# Patient Record
Sex: Female | Born: 1965 | Race: White | Hispanic: No | Marital: Married | Smoking: Never smoker
Health system: Southern US, Community
[De-identification: ages and names within clinical notes are randomized; demographics above are authoritative.]

## PROBLEM LIST (undated history)

## (undated) DIAGNOSIS — F419 Anxiety disorder, unspecified: Secondary | ICD-10-CM

## (undated) DIAGNOSIS — F429 Obsessive-compulsive disorder, unspecified: Secondary | ICD-10-CM

## (undated) DIAGNOSIS — F32A Depression, unspecified: Secondary | ICD-10-CM

## (undated) DIAGNOSIS — N189 Chronic kidney disease, unspecified: Secondary | ICD-10-CM

## (undated) DIAGNOSIS — K219 Gastro-esophageal reflux disease without esophagitis: Secondary | ICD-10-CM

## (undated) HISTORY — DX: Obsessive-compulsive disorder, unspecified: F42.9

## (undated) HISTORY — DX: Anxiety disorder, unspecified: F41.9

## (undated) HISTORY — DX: Depression, unspecified: F32.A

## (undated) HISTORY — DX: Gastro-esophageal reflux disease without esophagitis: K21.9

## (undated) HISTORY — PX: LITHOTRIPSY: SUR834

## (undated) HISTORY — DX: Chronic kidney disease, unspecified: N18.9

---

## 1988-01-28 HISTORY — PX: TONSILLECTOMY: SUR1361

## 2000-01-28 HISTORY — PX: COSMETIC SURGERY: SHX468

## 2007-03-02 ENCOUNTER — Ambulatory Visit: Payer: Self-pay | Admitting: Anesthesiology

## 2007-05-21 ENCOUNTER — Emergency Department: Payer: Self-pay | Admitting: Emergency Medicine

## 2007-05-31 ENCOUNTER — Ambulatory Visit: Payer: Self-pay | Admitting: Urology

## 2007-06-03 ENCOUNTER — Ambulatory Visit: Payer: Self-pay | Admitting: Urology

## 2007-06-03 ENCOUNTER — Other Ambulatory Visit: Payer: Self-pay

## 2007-06-04 ENCOUNTER — Observation Stay: Payer: Self-pay | Admitting: Urology

## 2007-06-07 ENCOUNTER — Emergency Department: Payer: Self-pay | Admitting: Emergency Medicine

## 2009-07-31 ENCOUNTER — Ambulatory Visit: Payer: Self-pay | Admitting: Unknown Physician Specialty

## 2010-12-04 ENCOUNTER — Ambulatory Visit: Payer: Self-pay

## 2011-12-31 ENCOUNTER — Emergency Department: Payer: Self-pay | Admitting: Emergency Medicine

## 2011-12-31 LAB — CBC WITH DIFFERENTIAL/PLATELET
Basophil #: 0 10*3/uL (ref 0.0–0.1)
Eosinophil #: 0.1 10*3/uL (ref 0.0–0.7)
HCT: 36.1 % (ref 35.0–47.0)
HGB: 12.5 g/dL (ref 12.0–16.0)
Lymphocyte %: 9.3 %
MCH: 30.4 pg (ref 26.0–34.0)
MCHC: 34.5 g/dL (ref 32.0–36.0)
Monocyte #: 0.8 x10 3/mm (ref 0.2–0.9)
Neutrophil %: 83.7 %
RDW: 13.4 % (ref 11.5–14.5)
WBC: 12.8 10*3/uL — ABNORMAL HIGH (ref 3.6–11.0)

## 2011-12-31 LAB — COMPREHENSIVE METABOLIC PANEL
Anion Gap: 8 (ref 7–16)
BUN: 16 mg/dL (ref 7–18)
Chloride: 109 mmol/L — ABNORMAL HIGH (ref 98–107)
Creatinine: 0.91 mg/dL (ref 0.60–1.30)
EGFR (African American): >60
Glucose: 131 mg/dL — ABNORMAL HIGH (ref 65–99)
Osmolality: 284 (ref 275–301)
Potassium: 3.5 mmol/L (ref 3.5–5.1)
SGOT(AST): 19 U/L (ref 15–37)
SGPT (ALT): 20 U/L (ref 12–78)
Sodium: 141 mmol/L (ref 136–145)
Total Protein: 6.8 g/dL (ref 6.4–8.2)

## 2011-12-31 LAB — URINALYSIS, COMPLETE
Nitrite: NEGATIVE
Ph: 8 (ref 4.5–8.0)
Protein: NEGATIVE
RBC,UR: 393 /HPF (ref 0–5)
Specific Gravity: 1.015 (ref 1.003–1.030)
Squamous Epithelial: 1
WBC UR: 2 /HPF (ref 0–5)

## 2011-12-31 LAB — LIPASE, BLOOD: Lipase: 124 U/L (ref 73–393)

## 2012-01-01 ENCOUNTER — Ambulatory Visit: Payer: Self-pay | Admitting: Urology

## 2012-01-01 LAB — PREGNANCY, URINE: Pregnancy Test, Urine: NEGATIVE m[IU]/mL

## 2013-01-27 DIAGNOSIS — N200 Calculus of kidney: Secondary | ICD-10-CM

## 2013-01-27 HISTORY — DX: Calculus of kidney: N20.0

## 2014-07-18 ENCOUNTER — Other Ambulatory Visit: Payer: Self-pay | Admitting: Family Medicine

## 2014-07-18 DIAGNOSIS — F429 Obsessive-compulsive disorder, unspecified: Secondary | ICD-10-CM

## 2014-07-18 NOTE — Telephone Encounter (Signed)
Pt needs refill of medication Zitalopram 40 MG/day at night. Pt uses [facility] in [place]. Pt was sched to come in on 08/02/14 but MD will be out of office and soonest appt based on pt needs is Wed 08/09/14. Pt just needs refill to last until pt sees MD.

## 2014-07-19 ENCOUNTER — Other Ambulatory Visit: Payer: Self-pay | Admitting: *Deleted

## 2014-07-19 DIAGNOSIS — F32A Depression, unspecified: Secondary | ICD-10-CM

## 2014-07-19 DIAGNOSIS — F329 Major depressive disorder, single episode, unspecified: Secondary | ICD-10-CM

## 2014-07-19 NOTE — Telephone Encounter (Signed)
Refill initiated and routed to Dr Manuella Ghazi

## 2014-07-27 NOTE — Telephone Encounter (Signed)
Patient is checking status on the citalopram. She only have 5 pills left. Please return call once completed

## 2014-07-28 MED ORDER — CITALOPRAM HYDROBROMIDE 40 MG PO TABS: 40.0000 mg | ORAL_TABLET | Freq: Every day | ORAL | Status: DC

## 2014-07-28 NOTE — Telephone Encounter (Signed)
Called patient to discuss her medication. She has not formally established care with this provider. 15 day supply of citalopram 40 mg daily is sent to patient's pharmacy

## 2014-08-02 ENCOUNTER — Ambulatory Visit: Payer: Self-pay | Admitting: Family Medicine

## 2014-08-09 ENCOUNTER — Encounter: Payer: Self-pay | Admitting: Family Medicine

## 2014-08-09 ENCOUNTER — Ambulatory Visit (INDEPENDENT_AMBULATORY_CARE_PROVIDER_SITE_OTHER): Payer: 59 | Admitting: Family Medicine

## 2014-08-09 ENCOUNTER — Encounter (INDEPENDENT_AMBULATORY_CARE_PROVIDER_SITE_OTHER): Payer: Self-pay

## 2014-08-09 VITALS — BP 133/70 | HR 99 | Temp 98.8°F | Resp 18 | Ht 63.0 in | Wt 154.9 lb

## 2014-08-09 DIAGNOSIS — D492 Neoplasm of unspecified behavior of bone, soft tissue, and skin: Secondary | ICD-10-CM | POA: Insufficient documentation

## 2014-08-09 DIAGNOSIS — F428 Other obsessive-compulsive disorder: Secondary | ICD-10-CM | POA: Insufficient documentation

## 2014-08-09 DIAGNOSIS — K219 Gastro-esophageal reflux disease without esophagitis: Secondary | ICD-10-CM | POA: Diagnosis not present

## 2014-08-09 DIAGNOSIS — R5382 Chronic fatigue, unspecified: Secondary | ICD-10-CM | POA: Insufficient documentation

## 2014-08-09 DIAGNOSIS — F42 Obsessive-compulsive disorder: Secondary | ICD-10-CM | POA: Diagnosis not present

## 2014-08-09 DIAGNOSIS — N200 Calculus of kidney: Secondary | ICD-10-CM | POA: Insufficient documentation

## 2014-08-09 DIAGNOSIS — F429 Obsessive-compulsive disorder, unspecified: Secondary | ICD-10-CM | POA: Insufficient documentation

## 2014-08-09 MED ORDER — OMEPRAZOLE 20 MG PO CPDR: 20.0000 mg | DELAYED_RELEASE_CAPSULE | Freq: Every day | ORAL | Status: DC

## 2014-08-09 MED ORDER — CITALOPRAM HYDROBROMIDE 40 MG PO TABS: 40.0000 mg | ORAL_TABLET | Freq: Every day | ORAL | Status: DC

## 2014-08-09 NOTE — Progress Notes (Signed)
Name: Gloria Harrison   MRN: [id number]    DOB: 06-Aug-1965   Date:08/09/2014       Progress Note  Subjective  Chief Complaint  Chief Complaint  Patient presents with  . Establish Care    NP (Dr. Moffeit)/ Fatigue  . Medication Refill  . Gastrophageal Reflux   Fatigue Pt. is feeling more and more fatigued recently. She is complaining of feeling tired all the time. 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 Her concentration is also not as optimal as it used to be. She feels fatigued at the end of the day and is ready to go to bed.  Gastrophageal Reflux She complains of heartburn. She reports no abdominal pain, no belching, no chest pain, no choking, no coughing, no dysphagia, no nausea or no sore throat. This is a chronic problem. The symptoms are aggravated by certain foods (spicy foods and eating late in the evening). She has tried a PPI and an antacid for the symptoms. Past procedures do not include an EGD.  Obsessive Compulsive Disorder Pt. Has history of intrusive thoughts which make her perform a compulsive activity to help relieve her anxiety. For example, if her pillow falls on the floor, she had to throw it away and not use it again or to shake it a certain number of times or wipe it to make sure it was no longer contaminated. Symptoms started around 20 years ago, initially diagnosed as anxiety and depression and later when her symptoms got worse around 12 years ago, she was diagnosed with OCD and has been taking an SSRI since then. On Citalopram, she does not experience intrusive thoughts as frequently as she did and does not have the compulsion to act on them.    Past Medical History  Diagnosis Date  . GERD (gastroesophageal reflux disease)   . OCD (obsessive compulsive disorder)     Past Surgical History  Procedure Laterality Date  . Cosmetic  surgery  2002    Tummy tuck  . Tonsillectomy  1990  . Cesarean section  1996  . Cesarean section  2001    Family History  Problem Relation Age of Onset  . Kidney Stones Mother   . Diabetes Father   . Hyperlipidemia Father   . Hypertension Father   . Asthma Son     History   Social History  . Marital Status: Married    Spouse Name: N/A  . Number of Children: N/A  . Years of Education: N/A   Occupational History  . Not on file.   Social History Main Topics  . Smoking status: Never Smoker   . Smokeless tobacco: Never Used  . Alcohol Use: No  . Drug Use: No  . Sexual Activity:    Partners: Male    Birth Control/ Protection: Other-see comments     Comment: Husband had a Vasectomy.   Other Topics Concern  . Not on file   Social History Narrative  . No narrative on file     Current outpatient prescriptions:  .  citalopram (CELEXA) 40 MG tablet, Take 1 tablet (40 mg total) by mouth daily., Disp: 15 tablet, Rfl: 0 .  citalopram (CELEXA) 40 MG tablet, Take 1 tablet by mouth at bedtime., Disp: , Rfl:  .  omeprazole (PRILOSEC) 20 MG capsule, Take 20 mg by mouth daily., Disp: ,  Rfl: 0  Allergies  Allergen Reactions  . Ciprofloxacin Hives     Review of Systems  Constitutional: Negative for malaise/fatigue.  HENT: Negative for sore throat.   Respiratory: Negative for cough, choking and shortness of breath.   Cardiovascular: Negative for chest pain.  Gastrointestinal: Positive for heartburn. Negative for dysphagia, nausea and abdominal pain.  Musculoskeletal: Positive for myalgias and neck pain.  Psychiatric/Behavioral: Negative for depression. The patient is nervous/anxious. The patient does not have insomnia.      Objective  Filed Vitals:   08/09/14 1528  BP: 133/70  Pulse: 99  Temp: 98.8 F (37.1 C)  TempSrc: Oral  Resp: 18  Height: 5\' 3"  (1.6 m)  Weight: 154 lb 14.4 oz (70.262 kg)  SpO2: 97%    Physical Exam  Constitutional: She is oriented to  person, place, and time and well-developed, well-nourished, and in no distress.  HENT:  Head: Normocephalic and atraumatic.  Eyes: Conjunctivae are normal. Pupils are equal, round, and reactive to light.  Neck: Normal range of motion. Neck supple.  Cardiovascular: Normal rate and regular rhythm.   Pulmonary/Chest: Effort normal and breath sounds normal.  Abdominal: Soft. Bowel sounds are normal.  Musculoskeletal: Normal range of motion.  Neurological: She is alert and oriented to person, place, and time.  Skin: Skin is warm and dry.  Psychiatric: Memory, affect and judgment normal.  Nursing note and vitals reviewed.     Assessment & Plan 1. Gastroesophageal reflux disease, esophagitis presence not specified DC omeprazole due to potential interaction with citalopram which can cause QT prolongation. Patient advised to use Zantac 150 mg every evening. Return for follow-up if symptoms of heartburn recur. - omeprazole (PRILOSEC) 20 MG capsule; Take 1 capsule (20 mg total) by mouth daily.  Dispense: 90 capsule; Refill: 1  2. OCD (obsessive compulsive disorder) In terms stable and responsive to Celexa. Refill provided. Follow-up in 6 months - citalopram (CELEXA) 40 MG tablet; Take 1 tablet (40 mg total) by mouth daily.  Dispense: 90 tablet; Refill: 1  3. Chronic fatigue Check labs for evaluation of chronic fatigue. - CBC with Differential - Comprehensive Metabolic Panel (CMET) - TSH - B12 - Vitamin D (25 hydroxy)   Gloria Harrison [facility] 08/09/2014 4:27 PM

## 2014-08-10 LAB — TSH: TSH: 1.38 u[IU]/mL (ref 0.450–4.500)

## 2014-08-10 LAB — CBC WITH DIFFERENTIAL/PLATELET
BASOS: 0 %
Basophils Absolute: 0 10*3/uL (ref 0.0–0.2)
EOS (ABSOLUTE): 0.1 10*3/uL (ref 0.0–0.4)
Eos: 1 %
HEMATOCRIT: 39.9 % (ref 34.0–46.6)
HEMOGLOBIN: 13.1 g/dL (ref 11.1–15.9)
IMMATURE GRANS (ABS): 0 10*3/uL (ref 0.0–0.1)
Immature Granulocytes: 0 %
LYMPHS ABS: 1.8 10*3/uL (ref 0.7–3.1)
Lymphs: 19 %
MCH: 28.5 pg (ref 26.6–33.0)
MCHC: 32.8 g/dL (ref 31.5–35.7)
MCV: 87 fL (ref 79–97)
MONOS ABS: 0.8 10*3/uL (ref 0.1–0.9)
Monocytes: 8 %
Neutrophils Absolute: 7.1 10*3/uL — ABNORMAL HIGH (ref 1.4–7.0)
Neutrophils: 72 %
Platelets: 304 10*3/uL (ref 150–379)
RBC: 4.59 x10E6/uL (ref 3.77–5.28)
RDW: 14.2 % (ref 12.3–15.4)
WBC: 9.9 10*3/uL (ref 3.4–10.8)

## 2014-08-10 LAB — COMPREHENSIVE METABOLIC PANEL
A/G RATIO: 1.8 (ref 1.1–2.5)
ALBUMIN: 4.4 g/dL (ref 3.5–5.5)
ALT: 15 IU/L (ref 0–32)
AST: 12 IU/L (ref 0–40)
Alkaline Phosphatase: 62 IU/L (ref 39–117)
BUN/Creatinine Ratio: 18 (ref 9–23)
BUN: 11 mg/dL (ref 6–24)
Bilirubin Total: 0.3 mg/dL (ref 0.0–1.2)
CALCIUM: 9.6 mg/dL (ref 8.7–10.2)
CHLORIDE: 100 mmol/L (ref 97–108)
CO2: 24 mmol/L (ref 18–29)
Creatinine, Ser: 0.62 mg/dL (ref 0.57–1.00)
GFR calc Af Amer: 125 mL/min/{1.73_m2} (ref >59)
GFR calc non Af Amer: 108 mL/min/{1.73_m2} (ref >59)
GLOBULIN, TOTAL: 2.5 g/dL (ref 1.5–4.5)
Glucose: 89 mg/dL (ref 65–99)
Potassium: 3.9 mmol/L (ref 3.5–5.2)
SODIUM: 141 mmol/L (ref 134–144)
TOTAL PROTEIN: 6.9 g/dL (ref 6.0–8.5)

## 2014-08-10 LAB — VITAMIN D 25 HYDROXY (VIT D DEFICIENCY, FRACTURES): Vit D, 25-Hydroxy: 24.9 ng/mL — ABNORMAL LOW (ref 30.0–100.0)

## 2014-08-10 LAB — VITAMIN B12: Vitamin B-12: 564 pg/mL (ref 211–946)

## 2014-08-17 ENCOUNTER — Other Ambulatory Visit: Payer: Self-pay | Admitting: Family Medicine

## 2014-08-17 MED ORDER — VITAMIN D3 1.25 MG (50000 UT) PO TABS: 1.0000 | ORAL_TABLET | ORAL | Status: DC

## 2014-08-17 NOTE — Telephone Encounter (Signed)
Per Dr. Manuella Ghazi sent prescription for Vitamin D #12 for 12 weeks to [facility], results have been reported to patient.

## 2014-11-03 ENCOUNTER — Telehealth: Payer: Self-pay | Admitting: Family Medicine

## 2014-11-03 NOTE — Telephone Encounter (Signed)
Patient is wanting an alternative medication to the omeprazole for her heart burn.  Pt was taken off the omeprazole due to the potential side effect to the other medication that she is taken, citalopram.  Patient did try the otc medications but has had no relief.  Patient can be reached at [phone].

## 2014-11-03 NOTE — Telephone Encounter (Signed)
Routed to Dr. Shah for medication change 

## 2014-11-05 NOTE — Telephone Encounter (Signed)
Called and left a voice message for patient to schedule an office visit appointment to discuss an alternative medication

## 2014-11-06 NOTE — Telephone Encounter (Signed)
Dr. Manuella Ghazi has called and left a voice message for patient to schedule an office visit appointment to discuss an alternative medication

## 2014-11-22 ENCOUNTER — Encounter: Payer: Self-pay | Admitting: Family Medicine

## 2014-11-22 ENCOUNTER — Ambulatory Visit (INDEPENDENT_AMBULATORY_CARE_PROVIDER_SITE_OTHER): Payer: 59 | Admitting: Family Medicine

## 2014-11-22 VITALS — BP 132/71 | HR 113 | Temp 98.5°F | Resp 20 | Ht 63.0 in | Wt 159.2 lb

## 2014-11-22 DIAGNOSIS — G8929 Other chronic pain: Secondary | ICD-10-CM | POA: Insufficient documentation

## 2014-11-22 DIAGNOSIS — Z23 Encounter for immunization: Secondary | ICD-10-CM | POA: Diagnosis not present

## 2014-11-22 DIAGNOSIS — M79651 Pain in right thigh: Secondary | ICD-10-CM

## 2014-11-22 DIAGNOSIS — M545 Low back pain, unspecified: Secondary | ICD-10-CM | POA: Insufficient documentation

## 2014-11-22 DIAGNOSIS — K219 Gastro-esophageal reflux disease without esophagitis: Secondary | ICD-10-CM

## 2014-11-22 DIAGNOSIS — F429 Obsessive-compulsive disorder, unspecified: Secondary | ICD-10-CM

## 2014-11-22 MED ORDER — CELECOXIB 100 MG PO CAPS: 100.0000 mg | ORAL_CAPSULE | Freq: Two times a day (BID) | ORAL | Status: DC

## 2014-11-22 MED ORDER — PANTOPRAZOLE SODIUM 40 MG PO TBEC: 40.0000 mg | DELAYED_RELEASE_TABLET | Freq: Every day | ORAL | Status: DC

## 2014-11-22 MED ORDER — CITALOPRAM HYDROBROMIDE 40 MG PO TABS: 40.0000 mg | ORAL_TABLET | Freq: Every day | ORAL | Status: DC

## 2014-11-22 NOTE — Progress Notes (Signed)
Name: Gloria Harrison   MRN: [id number]    DOB: 25-Oct-1965   Date:11/22/2014       Progress Note  Subjective  Chief Complaint  Chief Complaint  Patient presents with  . Advice Only    Discuss medication Prilosec (generic brand) not working  . Medication Refill    celexa 40mg     Heartburn She complains of belching and heartburn. She reports no abdominal pain, no coughing, no dysphagia, no nausea or no sore throat. This is a chronic problem. The problem has been gradually worsening (worse since stopped Omeprazole. OTC antacids have not relieved her symptoms.). She has tried a PPI and an antacid for the symptoms.  Back Pain This is a recurrent problem. Episode onset: 2 weeks. The pain is present in the lumbar spine. The quality of the pain is described as aching. The pain radiates to the right thigh. The pain is at a severity of 4/10. The symptoms are aggravated by lying down. Pertinent negatives include no abdominal pain, bladder incontinence, bowel incontinence, fever, paresthesias, tingling or weakness. She has tried NSAIDs (OTC Tylenol) for the symptoms. The treatment provided moderate relief.    Past Medical History  Diagnosis Date  . GERD (gastroesophageal reflux disease)   . OCD (obsessive compulsive disorder)     Past Surgical History  Procedure Laterality Date  . Cosmetic surgery  2002    Tummy tuck  . Tonsillectomy  1990  . Cesarean section  1996  . Cesarean section  2001    Family History  Problem Relation Age of Onset  . Kidney Stones Mother   . Diabetes Father   . Hyperlipidemia Father   . Hypertension Father   . Asthma Son     Social History   Social History  . Marital Status: Married    Spouse Name: N/A  . Number of Children: N/A  . Years of Education: N/A   Occupational History  . Not on file.   Social History Main Topics  . Smoking status: Never Smoker   . Smokeless tobacco: Never Used  . Alcohol Use: No  . Drug Use: No  . Sexual Activity:   Partners: Male    Birth Control/ Protection: Other-see comments     Comment: Husband had a Vasectomy.   Other Topics Concern  . Not on file   Social History Narrative     Current outpatient prescriptions:  .  Cholecalciferol (VITAMIN D3) 50000 UNITS TABS, Take 1 tablet by mouth once a week., Disp: 12 tablet, Rfl: 0 .  citalopram (CELEXA) 40 MG tablet, Take 1 tablet (40 mg total) by mouth daily., Disp: 90 tablet, Rfl: 1  Allergies  Allergen Reactions  . Ciprofloxacin Hives     Review of Systems  Constitutional: Negative for fever and chills.  HENT: Negative for sore throat.   Respiratory: Negative for cough.   Gastrointestinal: Positive for heartburn. Negative for dysphagia, nausea, vomiting, abdominal pain and bowel incontinence.  Genitourinary: Negative for bladder incontinence.  Musculoskeletal: Positive for myalgias and back pain.  Neurological: Negative for tingling, weakness and paresthesias.  Psychiatric/Behavioral: Negative for depression. The patient is nervous/anxious.    Objective  Filed Vitals:   11/22/14 1456  BP: 132/71  Pulse: 113  Temp: 98.5 F (36.9 C)  TempSrc: Oral  Resp: 20  Height: 5\' 3"  (1.6 m)  Weight: 159 lb 3.2 oz (72.213 kg)  SpO2: 96%    Physical Exam  Constitutional: She is oriented to person, place, and time and well-developed,  well-nourished, and in no distress.  Cardiovascular: Normal rate, regular rhythm and normal heart sounds.   No murmur heard. Pulmonary/Chest: Effort normal and breath sounds normal. No respiratory distress. She has no wheezes.  Abdominal: Soft. Bowel sounds are normal. There is no tenderness.  Musculoskeletal:       Lumbar back: She exhibits normal range of motion, no tenderness and no pain.       Right upper leg: She exhibits no tenderness, no bony tenderness and no edema.  Mild tenderness to palpation over the right medial thigh, no muscle stiffness  Neurological: She is alert and oriented to person, place,  and time.  Nursing note and vitals reviewed.    Assessment & Plan  1. Need for immunization against influenza  - Flu Vaccine QUAD 36+ mos PF IM (Fluarix & Fluzone Quad PF)  2. Gastroesophageal reflux disease, esophagitis presence not specified We will start on pantoprazole 40 mg daily for symptoms of heartburn and reflux. - pantoprazole (PROTONIX) 40 MG tablet; Take 1 tablet (40 mg total) by mouth daily.  Dispense: 90 tablet; Refill: 0  3. OCD (obsessive compulsive disorder)  - citalopram (CELEXA) 40 MG tablet; Take 1 tablet (40 mg total) by mouth daily.  Dispense: 90 tablet; Refill: 1  4. Right thigh pain Likely from muscle sprain. Will start on Celebrex for 5 days and reassess. - celecoxib (CELEBREX) 100 MG capsule; Take 1 capsule (100 mg total) by mouth 2 (two) times daily.  Dispense: 10 capsule; Refill: 0   Sammye Staff [facility] 11/22/2014 3:22 PM

## 2015-01-15 ENCOUNTER — Encounter: Payer: Self-pay | Admitting: Family Medicine

## 2015-02-02 ENCOUNTER — Ambulatory Visit
Admission: RE | Admit: 2015-02-02 | Discharge: 2015-02-02 | Disposition: A | Discharge status: Home / Self Care | Payer: PRIVATE HEALTH INSURANCE | Source: Ambulatory Visit | Attending: Family Medicine | Admitting: Family Medicine

## 2015-02-02 ENCOUNTER — Encounter: Payer: Self-pay | Admitting: Family Medicine

## 2015-02-02 ENCOUNTER — Ambulatory Visit (INDEPENDENT_AMBULATORY_CARE_PROVIDER_SITE_OTHER): Payer: PRIVATE HEALTH INSURANCE | Admitting: Family Medicine

## 2015-02-02 VITALS — BP 128/70 | HR 97 | Temp 98.3°F | Resp 17 | Ht 63.0 in | Wt 160.9 lb

## 2015-02-02 DIAGNOSIS — M545 Low back pain: Secondary | ICD-10-CM | POA: Insufficient documentation

## 2015-02-02 DIAGNOSIS — G8929 Other chronic pain: Secondary | ICD-10-CM | POA: Insufficient documentation

## 2015-02-02 MED ORDER — TIZANIDINE HCL 4 MG PO CAPS: 4.0000 mg | ORAL_CAPSULE | Freq: Three times a day (TID) | ORAL | Status: DC | PRN

## 2015-02-02 NOTE — Progress Notes (Signed)
Name: Gloria Harrison   MRN: [id number]    DOB: 1965/04/23   Date:02/02/2015       Progress Note  Subjective  Chief Complaint  Chief Complaint  Patient presents with  . Back Pain    Back Pain This is a chronic problem. Episode onset: Onset 4 months ago. The pain is present in the lumbar spine. The pain radiates to the right thigh. The pain is at a severity of 4/10. The pain is moderate (thinks she is 'getting used to the pain'). The symptoms are aggravated by bending, position and lying down. Pertinent negatives include no bladder incontinence, bowel incontinence, leg pain, numbness, paresis or tingling. She has tried chiropractic manipulation, analgesics and NSAIDs for the symptoms. The treatment provided moderate (moderate improvement with chiropractic manipulation.) relief.    Past Medical History  Diagnosis Date  . GERD (gastroesophageal reflux disease)   . OCD (obsessive compulsive disorder)     Past Surgical History  Procedure Laterality Date  . Cosmetic surgery  2002    Tummy tuck  . Tonsillectomy  1990  . Cesarean section  1996  . Cesarean section  2001    Family History  Problem Relation Age of Onset  . Kidney Stones Mother   . Diabetes Father   . Hyperlipidemia Father   . Hypertension Father   . Asthma Son     Social History   Social History  . Marital Status: Married    Spouse Name: N/A  . Number of Children: N/A  . Years of Education: N/A   Occupational History  . Not on file.   Social History Main Topics  . Smoking status: Never Smoker   . Smokeless tobacco: Never Used  . Alcohol Use: No  . Drug Use: No  . Sexual Activity:    Partners: Male    Birth Control/ Protection: Other-see comments     Comment: Husband had a Vasectomy.   Other Topics Concern  . Not on file   Social History Narrative     Current outpatient prescriptions:  .  citalopram (CELEXA) 40 MG tablet, Take 1 tablet (40 mg total) by mouth daily., Disp: 90 tablet, Rfl: 1 .   pantoprazole (PROTONIX) 40 MG tablet, Take 1 tablet (40 mg total) by mouth daily., Disp: 90 tablet, Rfl: 0 .  celecoxib (CELEBREX) 100 MG capsule, Take 1 capsule (100 mg total) by mouth 2 (two) times daily. (Patient not taking: Reported on 02/02/2015), Disp: 10 capsule, Rfl: 0 .  Cholecalciferol (VITAMIN D3) 50000 UNITS TABS, Take 1 tablet by mouth once a week. (Patient not taking: Reported on 02/02/2015), Disp: 12 tablet, Rfl: 0  Allergies  Allergen Reactions  . Ciprofloxacin Hives     Review of Systems  Gastrointestinal: Negative for bowel incontinence.  Genitourinary: Negative for bladder incontinence.  Musculoskeletal: Positive for back pain. Negative for joint pain.  Neurological: Negative for tingling and numbness.    Objective  Filed Vitals:   02/02/15 1224  BP: 128/70  Pulse: 97  Temp: 98.3 F (36.8 C)  TempSrc: Oral  Resp: 17  Height: 5\' 3"  (1.6 m)  Weight: 160 lb 14.4 oz (72.984 kg)  SpO2: 95%    Physical Exam  Constitutional: She is well-developed, well-nourished, and in no distress.  Musculoskeletal:       Lumbar back: She exhibits tenderness, pain and spasm.       Back:  Nursing note and vitals reviewed.     Assessment & Plan  1. Chronic right-sided low  back pain without sciatica No relief with Celebrex. We'll obtain x-ray of lumbar spine to rule out musculoskeletal etiologies. Started on muscle relaxant therapy. - tiZANidine (ZANAFLEX) 4 MG capsule; Take 1 capsule (4 mg total) by mouth 3 (three) times daily as needed for muscle spasms.  Dispense: 30 capsule; Refill: 0 - DG Lumbar Spine Complete; Future   Amine Adelson [facility] 02/02/2015 12:31 PM

## 2015-02-07 ENCOUNTER — Ambulatory Visit: Payer: 59 | Admitting: Family Medicine

## 2015-03-10 ENCOUNTER — Other Ambulatory Visit: Payer: Self-pay | Admitting: Family Medicine

## 2015-03-26 ENCOUNTER — Telehealth: Payer: Self-pay | Admitting: Family Medicine

## 2015-03-26 NOTE — Telephone Encounter (Signed)
Patient is requesting a 3-6 month supply of Pantoprazole. States that it will be less expensive done this route. If you are not comfortable at doing a 6 month supply she is okay with the 3 month supply. Please send to [email or web address] and she will have it transferred to the company

## 2015-03-27 MED ORDER — PANTOPRAZOLE SODIUM 40 MG PO TBEC: 40.0000 mg | DELAYED_RELEASE_TABLET | Freq: Every day | ORAL | Status: DC

## 2015-03-27 NOTE — Telephone Encounter (Signed)
Medication has been refilled and sent to [facility] 

## 2015-04-10 ENCOUNTER — Telehealth: Payer: Self-pay | Admitting: Family Medicine

## 2015-04-10 NOTE — Telephone Encounter (Signed)
Routed to Dr. Shah for advice  

## 2015-04-10 NOTE — Telephone Encounter (Signed)
Patient states that you have tried giving prescription for ibuprofen and muscle relaxer and neither did not help. Have completed xray and nothing was found. Requesting a recommendation for physical therapy or Cortizone shot because she is not able to get any relief on her lower right back that runs down to knee.

## 2015-04-11 NOTE — Telephone Encounter (Signed)
Returned call and left a voice message for patient. Need to discuss her symptoms and then can place possible referral to either physical therapy or orthopedics.

## 2015-04-13 ENCOUNTER — Telehealth: Payer: Self-pay

## 2015-04-13 ENCOUNTER — Telehealth: Payer: Self-pay | Admitting: Family Medicine

## 2015-04-13 DIAGNOSIS — M545 Low back pain: Principal | ICD-10-CM

## 2015-04-13 DIAGNOSIS — G8929 Other chronic pain: Secondary | ICD-10-CM

## 2015-04-13 MED ORDER — TIZANIDINE HCL 4 MG PO CAPS: 4.0000 mg | ORAL_CAPSULE | Freq: Every day | ORAL | Status: DC

## 2015-04-13 NOTE — Telephone Encounter (Signed)
Prescription has been faxed to [facility] per Dr. Manuella Ghazi

## 2015-04-25 NOTE — Telephone Encounter (Signed)
errenous °

## 2015-05-25 ENCOUNTER — Other Ambulatory Visit: Payer: Self-pay | Admitting: Family Medicine

## 2015-06-08 ENCOUNTER — Encounter: Payer: Self-pay | Admitting: Family Medicine

## 2015-06-08 ENCOUNTER — Ambulatory Visit (INDEPENDENT_AMBULATORY_CARE_PROVIDER_SITE_OTHER): Payer: PRIVATE HEALTH INSURANCE | Admitting: Family Medicine

## 2015-06-08 VITALS — BP 120/72 | HR 111 | Temp 97.9°F | Resp 14 | Ht 63.0 in | Wt 160.9 lb

## 2015-06-08 DIAGNOSIS — F429 Obsessive-compulsive disorder, unspecified: Secondary | ICD-10-CM

## 2015-06-08 MED ORDER — CITALOPRAM HYDROBROMIDE 40 MG PO TABS: 40.0000 mg | ORAL_TABLET | Freq: Every day | ORAL | Status: DC

## 2015-06-08 NOTE — Progress Notes (Signed)
Name: Gloria Harrison   MRN: [id number]    DOB: 08/19/1965   Date:06/08/2015       Progress Note  Subjective  Chief Complaint  Chief Complaint  Patient presents with  . Depression    6 month follow up    HPI  Obsessive Compulsive Disorder: Improved, intrusive thoughts are still there but less frequent, no symptoms of depression but sometimes the intrusive thoughts make her anxious. Also has bizarre associations which are rare, no suicidal thoughts.  Past Medical History  Diagnosis Date  . GERD (gastroesophageal reflux disease)   . OCD (obsessive compulsive disorder)     Past Surgical History  Procedure Laterality Date  . Cosmetic surgery  2002    Tummy tuck  . Tonsillectomy  1990  . Cesarean section  1996  . Cesarean section  2001    Family History  Problem Relation Age of Onset  . Kidney Stones Mother   . Diabetes Father   . Hyperlipidemia Father   . Hypertension Father   . Asthma Son     Social History   Social History  . Marital Status: Married    Spouse Name: N/A  . Number of Children: N/A  . Years of Education: N/A   Occupational History  . Not on file.   Social History Main Topics  . Smoking status: Never Smoker   . Smokeless tobacco: Never Used  . Alcohol Use: No  . Drug Use: No  . Sexual Activity:    Partners: Male    Birth Control/ Protection: Other-see comments     Comment: Husband had a Vasectomy.   Other Topics Concern  . Not on file   Social History Narrative     Current outpatient prescriptions:  .  Cholecalciferol (VITAMIN D3) 50000 UNITS TABS, Take 1 tablet by mouth once a week. (Patient not taking: Reported on 02/02/2015), Disp: 12 tablet, Rfl: 0 .  citalopram (CELEXA) 40 MG tablet, Take 1 tablet (40 mg total) by mouth daily., Disp: 90 tablet, Rfl: 1 .  pantoprazole (PROTONIX) 40 MG tablet, Take 1 tablet (40 mg total) by mouth daily., Disp: 90 tablet, Rfl: 0 .  tiZANidine (ZANAFLEX) 4 MG capsule, Take 1 capsule (4 mg total) by mouth  at bedtime., Disp: 15 capsule, Rfl: 0  Allergies  Allergen Reactions  . Ciprofloxacin Hives     Review of Systems  Musculoskeletal: Positive for back pain.  Psychiatric/Behavioral: Negative for depression. The patient is not nervous/anxious.      Objective  Filed Vitals:   06/08/15 1127  BP: 120/72  Pulse: 111  Temp: 97.9 F (36.6 C)  TempSrc: Oral  Resp: 14  Height: 5\' 3"  (1.6 m)  Weight: 160 lb 14.4 oz (72.984 kg)  SpO2: 98%    Physical Exam  Constitutional: She is oriented to person, place, and time and well-developed, well-nourished, and in no distress.  HENT:  Head: Normocephalic and atraumatic.  Cardiovascular: Normal rate and regular rhythm.   Pulmonary/Chest: Effort normal and breath sounds normal.  Neurological: She is alert and oriented to person, place, and time.  Psychiatric: Mood, memory, affect and judgment normal.  Nursing note and vitals reviewed.     Assessment & Plan  1. OCD (obsessive compulsive disorder) Stable and much improved, refill for Celexa provided. - citalopram (CELEXA) 40 MG tablet; Take 1 tablet (40 mg total) by mouth daily.  Dispense: 90 tablet; Refill: 1 .  S[place] Downs [facility] 06/08/2015 11:30  AM

## 2015-12-04 ENCOUNTER — Encounter: Payer: Self-pay | Admitting: Family Medicine

## 2015-12-04 ENCOUNTER — Ambulatory Visit (INDEPENDENT_AMBULATORY_CARE_PROVIDER_SITE_OTHER): Payer: PRIVATE HEALTH INSURANCE | Admitting: Family Medicine

## 2015-12-04 VITALS — BP 120/65 | HR 90 | Temp 98.7°F | Resp 16 | Ht 63.0 in | Wt 150.4 lb

## 2015-12-04 DIAGNOSIS — F429 Obsessive-compulsive disorder, unspecified: Secondary | ICD-10-CM

## 2015-12-04 DIAGNOSIS — K219 Gastro-esophageal reflux disease without esophagitis: Secondary | ICD-10-CM

## 2015-12-04 DIAGNOSIS — B354 Tinea corporis: Secondary | ICD-10-CM

## 2015-12-04 MED ORDER — CITALOPRAM HYDROBROMIDE 40 MG PO TABS: 40.0000 mg | ORAL_TABLET | Freq: Every day | ORAL | 1 refills | Status: DC

## 2015-12-04 MED ORDER — PANTOPRAZOLE SODIUM 40 MG PO TBEC: 40.0000 mg | DELAYED_RELEASE_TABLET | Freq: Every day | ORAL | 1 refills | Status: DC

## 2015-12-04 MED ORDER — CLOTRIMAZOLE-BETAMETHASONE 1-0.05 % EX LOTN: TOPICAL_LOTION | Freq: Two times a day (BID) | CUTANEOUS | 0 refills | Status: DC

## 2015-12-04 NOTE — Progress Notes (Signed)
Name: Gloria Harrison   MRN: [id number]    DOB: [birth year]   Date:12/04/2015       Progress Note  Subjective  Chief Complaint  Chief Complaint  Patient presents with  . Rash    right leg x10 days    Rash  This is a new problem. The current episode started in the past 7 days. The affected locations include the right upper leg. The rash is characterized by itchiness, redness and scaling. Pertinent negatives include no fever or sore throat. Past treatments include anti-itch cream (antifungal ringworm cream). The treatment provided no relief.  Gastroesophageal Reflux  She complains of heartburn. She reports no abdominal pain, no belching, no chest pain, no nausea or no sore throat. This is a chronic problem. The problem has been unchanged. The symptoms are aggravated by certain foods and lying down (eating close to bedtime and spicy foods brings on the symptoms). She has tried a PPI for the symptoms. The treatment provided significant relief. Past procedures do not include an EGD.   Obsessive Compulsive Disorder: Pt. Presents for medication refill and follow up on Obsessive Compulsive Disorder. Symptoms include anxiety, repetitive thoughts associated with bizarre associations. She has no symptoms as long as she takes Citalopram 40 mg daily, no side effects reported.   Past Medical History:  Diagnosis Date  . GERD (gastroesophageal reflux disease)   . OCD (obsessive compulsive disorder)     Past Surgical History:  Procedure Laterality Date  . CESAREAN SECTION  1996  . CESAREAN SECTION  2001  . COSMETIC SURGERY  2002   Tummy tuck  . TONSILLECTOMY  1990    Family History  Problem Relation Age of Onset  . Kidney Stones Mother   . Diabetes Father   . Hyperlipidemia Father   . Hypertension Father   . Asthma Son     Social History   Social History  . Marital status: Married    Spouse name: N/A  . Number of children: N/A  . Years of education: N/A   Occupational History  . Not  on file.   Social History Main Topics  . Smoking status: Never Smoker  . Smokeless tobacco: Never Used  . Alcohol use No  . Drug use: No  . Sexual activity: Yes    Partners: Male    Birth control/ protection: Other-see comments     Comment: Husband had a Vasectomy.   Other Topics Concern  . Not on file   Social History Narrative  . No narrative on file     Current Outpatient Prescriptions:  .  citalopram (CELEXA) 40 MG tablet, Take 1 tablet (40 mg total) by mouth daily., Disp: 90 tablet, Rfl: 1 .  pantoprazole (PROTONIX) 40 MG tablet, Take 1 tablet (40 mg total) by mouth daily., Disp: 90 tablet, Rfl: 0 .  Cholecalciferol (VITAMIN D3) 50000 UNITS TABS, Take 1 tablet by mouth once a week. (Patient not taking: Reported on 12/04/2015), Disp: 12 tablet, Rfl: 0 .  tiZANidine (ZANAFLEX) 4 MG capsule, Take 1 capsule (4 mg total) by mouth at bedtime. (Patient not taking: Reported on 12/04/2015), Disp: 15 capsule, Rfl: 0  Allergies  Allergen Reactions  . Ciprofloxacin Hives     Review of Systems  Constitutional: Negative for chills, fever and malaise/fatigue.  HENT: Negative for sore throat.   Cardiovascular: Negative for chest pain.  Gastrointestinal: Positive for heartburn. Negative for abdominal pain and nausea.  Skin: Positive for rash.    Objective  Vitals:  12/04/15 1611  BP: 120/65  Pulse: 90  Resp: 16  Temp: 98.7 F (37.1 C)  TempSrc: Oral  SpO2: 98%  Weight: 150 lb 6.4 oz (68.2 kg)  Height: 5\' 3"  (1.6 m)    Physical Exam  Constitutional: She is oriented to person, place, and time and well-developed, well-nourished, and in no distress.  HENT:  Head: Normocephalic and atraumatic.  Cardiovascular: Normal rate, regular rhythm and normal heart sounds.   No murmur heard. Pulmonary/Chest: Effort normal and breath sounds normal.  Neurological: She is alert and oriented to person, place, and time.  Skin: Skin is warm. Rash noted. Rash is maculopapular. There is  erythema.  Psychiatric: Mood, memory, affect and judgment normal.  Nursing note and vitals reviewed.    Assessment & Plan  1. Tinea corporis Started on Lotrisone for treatment - clotrimazole-betamethasone (LOTRISONE) lotion; Apply topically 2 (two) times daily.  Dispense: 60 mL; Refill: 0  2. Obsessive-compulsive disorder, unspecified type  - citalopram (CELEXA) 40 MG tablet; Take 1 tablet (40 mg total) by mouth daily.  Dispense: 90 tablet; Refill: 1  3. Gastroesophageal reflux disease, esophagitis presence not specified  - pantoprazole (PROTONIX) 40 MG tablet; Take 1 tablet (40 mg total) by mouth daily.  Dispense: 90 tablet; Refill: 1   Galilea Quito [facility] 12/04/2015 4:24 PM

## 2015-12-05 ENCOUNTER — Telehealth: Payer: Self-pay | Admitting: Family Medicine

## 2015-12-05 NOTE — Telephone Encounter (Signed)
errenous °

## 2015-12-06 ENCOUNTER — Telehealth: Payer: Self-pay | Admitting: Family Medicine

## 2015-12-06 DIAGNOSIS — B354 Tinea corporis: Secondary | ICD-10-CM

## 2015-12-06 MED ORDER — CLOTRIMAZOLE-BETAMETHASONE 1-0.05 % EX CREA: 1.0000 "application " | TOPICAL_CREAM | Freq: Two times a day (BID) | CUTANEOUS | 0 refills | Status: DC

## 2015-12-06 NOTE — Telephone Encounter (Signed)
Patient was prescribed clotrimazole-betamethasone lotion and it will cost over $200. Patient called insurance and they will cover it in cream form and states it will have to be in 45grams. Prescribe it however you can to equal up to the 60 mL. Please send to [facility]

## 2015-12-06 NOTE — Telephone Encounter (Signed)
Changed to Lotrisone cream and Rx sent to pharmacy

## 2015-12-06 NOTE — Telephone Encounter (Signed)
Patient verbally informed °

## 2015-12-28 ENCOUNTER — Other Ambulatory Visit: Payer: Self-pay | Admitting: Family Medicine

## 2015-12-28 DIAGNOSIS — F429 Obsessive-compulsive disorder, unspecified: Secondary | ICD-10-CM

## 2016-03-31 ENCOUNTER — Telehealth: Payer: Self-pay | Admitting: Family Medicine

## 2016-03-31 NOTE — Telephone Encounter (Signed)
PT CALLED AND READ HER THE MESSAGE AND HAD TEMEKA TO TALK TO HER.

## 2016-03-31 NOTE — Telephone Encounter (Signed)
Pt is currently in [place] due to son trying to commit suicide. She would like to [facility] if it is possible that you give her a prescription for something that will help her relax/sleep. She need something to help cope. Maybe a small supply. Please send to [facility] (P) [phone]

## 2016-03-31 NOTE — Telephone Encounter (Signed)
Based on patient's symptoms and situation, she would benefit from alprazolam 0.25 mg daily at bedtime when necessary reduce anxiety and help with sleeping. However, alprazolam is a controlled substance and then not be called in to pharmacy in [place]. It has to be picked up in office.

## 2016-06-06 ENCOUNTER — Other Ambulatory Visit: Payer: Self-pay | Admitting: Family Medicine

## 2016-06-06 DIAGNOSIS — F429 Obsessive-compulsive disorder, unspecified: Secondary | ICD-10-CM

## 2016-06-06 NOTE — Telephone Encounter (Signed)
Covering for colleague Last note reviewed She was due to be seen around 06/02/16 for f/u I don't see an appt I'll refill medicine, but please ask her to schedule a f/u appt with Dr. Manuella Ghazi in the next few weeks Thank you

## 2016-06-10 ENCOUNTER — Telehealth: Payer: Self-pay | Admitting: Family Medicine

## 2016-06-10 NOTE — Telephone Encounter (Signed)
Pt is currently taking protonix for heartburn and has found out that it causes alzheimers disease (through her husband doctor) now she would like to switch it to Famotidine 20mg  2x daily. Asking that you please send new script to [facility]. [phone]

## 2016-06-10 NOTE — Telephone Encounter (Signed)
I am not aware of Alzheimer's disease as being a side effect of proton X, or any other PPI for that matter. Please schedule patient for an appointment to discuss switching to a different reflux agent. I have tried to contact the patient and left a voice message

## 2016-06-11 NOTE — Telephone Encounter (Signed)
Pt will call back to schedule an appointment once she check her schedule

## 2016-06-13 ENCOUNTER — Ambulatory Visit (INDEPENDENT_AMBULATORY_CARE_PROVIDER_SITE_OTHER): Payer: PRIVATE HEALTH INSURANCE | Admitting: Family Medicine

## 2016-06-13 ENCOUNTER — Encounter: Payer: Self-pay | Admitting: Family Medicine

## 2016-06-13 VITALS — BP 122/80 | HR 104 | Temp 98.2°F | Resp 16 | Ht 63.0 in | Wt 150.4 lb

## 2016-06-13 DIAGNOSIS — K219 Gastro-esophageal reflux disease without esophagitis: Secondary | ICD-10-CM | POA: Diagnosis not present

## 2016-06-13 MED ORDER — RANITIDINE HCL 150 MG PO TABS: 150.0000 mg | ORAL_TABLET | Freq: Two times a day (BID) | ORAL | 0 refills | Status: DC

## 2016-06-13 NOTE — Progress Notes (Signed)
Name: Gloria Harrison   MRN: [id number]    DOB: 20-Nov-1965   Date:06/13/2016       Progress Note  Subjective  Chief Complaint  Chief Complaint  Patient presents with  . Gastroesophageal Reflux    would like to try Famotidine 20 mg instead of Portonix    Gastroesophageal Reflux  She reports no belching, no chest pain, no coughing, no dysphagia, no early satiety or no heartburn. This is a chronic problem. The problem has been unchanged. Pertinent negatives include no anemia. She has tried a PPI (would like to change to a different agent which can be taken at the onset of heartburn instead of taking before she eats to prevent heartburn) for the symptoms. The treatment provided significant relief. Past procedures do not include an EGD.     Past Medical History:  Diagnosis Date  . GERD (gastroesophageal reflux disease)   . OCD (obsessive compulsive disorder)     Past Surgical History:  Procedure Laterality Date  . CESAREAN SECTION  1996  . CESAREAN SECTION  2001  . COSMETIC SURGERY  2002   Tummy tuck  . TONSILLECTOMY  1990    Family History  Problem Relation Age of Onset  . Kidney Stones Mother   . Diabetes Father   . Hyperlipidemia Father   . Hypertension Father   . Asthma Son     Social History   Social History  . Marital status: Married    Spouse name: N/A  . Number of children: N/A  . Years of education: N/A   Occupational History  . Not on file.   Social History Main Topics  . Smoking status: Never Smoker  . Smokeless tobacco: Never Used  . Alcohol use No  . Drug use: No  . Sexual activity: Yes    Partners: Male    Birth control/ protection: Other-see comments     Comment: Husband had a Vasectomy.   Other Topics Concern  . Not on file   Social History Narrative  . No narrative on file     Current Outpatient Prescriptions:  .  citalopram (CELEXA) 40 MG tablet, TAKE 1 TABLET (40 MG TOTAL) BY MOUTH DAILY., Disp: 30 tablet, Rfl: 0 .  Cholecalciferol  (VITAMIN D3) 50000 UNITS TABS, Take 1 tablet by mouth once a week. (Patient not taking: Reported on 12/04/2015), Disp: 12 tablet, Rfl: 0  Allergies  Allergen Reactions  . Ciprofloxacin Hives     Review of Systems  Respiratory: Negative for cough.   Cardiovascular: Negative for chest pain.  Gastrointestinal: Negative for dysphagia and heartburn.      Objective  Vitals:   06/13/16 1522  BP: 122/80  Pulse: (!) 104  Resp: 16  Temp: 98.2 F (36.8 C)  TempSrc: Oral  SpO2: 97%  Weight: 150 lb 6.4 oz (68.2 kg)  Height: 5\' 3"  (1.6 m)    Physical Exam  Constitutional: She is oriented to person, place, and time and well-developed, well-nourished, and in no distress.  HENT:  Head: Normocephalic and atraumatic.  Cardiovascular: Normal rate, regular rhythm and normal heart sounds.   No murmur heard. Pulmonary/Chest: Effort normal and breath sounds normal. She has no wheezes.  Abdominal: Soft. Bowel sounds are normal. There is no tenderness.  Neurological: She is alert and oriented to person, place, and time.  Psychiatric: Mood, memory, affect and judgment normal.  Nursing note and vitals reviewed.    Assessment & Plan  1. Gastroesophageal reflux disease, esophagitis presence not specified Recommended  Zantac to be taken at the onset of heartburn, start with 1 a day, may progress to 2 a day as needed - ranitidine (ZANTAC) 150 MG tablet; Take 1 tablet (150 mg total) by mouth 2 (two) times daily.  Dispense: 180 tablet; Refill: 0   Daryan Cagley [facility] 06/13/2016 3:38 PM

## 2016-07-13 ENCOUNTER — Other Ambulatory Visit: Payer: Self-pay | Admitting: Family Medicine

## 2016-07-13 DIAGNOSIS — F429 Obsessive-compulsive disorder, unspecified: Secondary | ICD-10-CM

## 2016-10-06 ENCOUNTER — Telehealth: Payer: Self-pay | Admitting: Family Medicine

## 2016-10-06 NOTE — Telephone Encounter (Signed)
Returned call and left a voice message for the patient. 

## 2016-10-06 NOTE — Telephone Encounter (Signed)
Pt would like to know if she can get a return call about her heartburn medication.

## 2016-10-08 ENCOUNTER — Ambulatory Visit: Payer: PRIVATE HEALTH INSURANCE | Admitting: Family Medicine

## 2016-10-22 ENCOUNTER — Other Ambulatory Visit: Payer: Self-pay | Admitting: Family Medicine

## 2016-10-22 DIAGNOSIS — F429 Obsessive-compulsive disorder, unspecified: Secondary | ICD-10-CM

## 2016-11-06 ENCOUNTER — Encounter: Payer: Self-pay | Admitting: Family Medicine

## 2016-11-06 ENCOUNTER — Ambulatory Visit (INDEPENDENT_AMBULATORY_CARE_PROVIDER_SITE_OTHER): Payer: PRIVATE HEALTH INSURANCE | Admitting: Family Medicine

## 2016-11-06 VITALS — BP 110/64 | HR 96 | Ht 63.0 in | Wt 152.8 lb

## 2016-11-06 DIAGNOSIS — F422 Mixed obsessional thoughts and acts: Secondary | ICD-10-CM | POA: Diagnosis not present

## 2016-11-06 DIAGNOSIS — K219 Gastro-esophageal reflux disease without esophagitis: Secondary | ICD-10-CM | POA: Diagnosis not present

## 2016-11-06 DIAGNOSIS — F331 Major depressive disorder, recurrent, moderate: Secondary | ICD-10-CM

## 2016-11-06 MED ORDER — BUPROPION HCL 100 MG PO TABS: 100.0000 mg | ORAL_TABLET | Freq: Two times a day (BID) | ORAL | 0 refills | Status: DC

## 2016-11-06 MED ORDER — CITALOPRAM HYDROBROMIDE 40 MG PO TABS: 40.0000 mg | ORAL_TABLET | Freq: Every day | ORAL | 0 refills | Status: DC

## 2016-11-06 MED ORDER — PANTOPRAZOLE SODIUM 40 MG PO TBEC: 40.0000 mg | DELAYED_RELEASE_TABLET | Freq: Every day | ORAL | 0 refills | Status: DC

## 2016-11-06 NOTE — Progress Notes (Signed)
Name: Gloria Harrison   MRN: [id number]    DOB: Jul 10, 1965   Date:11/06/2016       Progress Note  Subjective  Chief Complaint  Chief Complaint  Patient presents with  . Gastroesophageal Reflux    Pt states that she wants to go back to another reflux med like prilosec   . Depression    Pt states that she is feeling like she is getting depressed, celxa is not helping    Gastroesophageal Reflux  She complains of heartburn and nausea. She reports no abdominal pain, no coughing or no sore throat. This is a chronic problem. The problem occurs frequently. Associated symptoms include fatigue. She has tried a PPI and a histamine-2 antagonist (Has tried Zantac which did not work, wants to go back to PPI) for the symptoms. Past procedures do not include an EGD.  Depression         This is a chronic problem.  The onset quality is gradual.   The problem has been gradually worsening (her son tried to committ suicide in his dorm room in March 2018, since then she has become increasingly depressed, worries about herson, OCD symptoms have flared up, constant doom and gloom. ) since onset.  Associated symptoms include fatigue, helplessness, hopelessness, appetite change and sad.  Associated symptoms include no suicidal ideas.  Past treatments include SSRIs - Selective serotonin reuptake inhibitors.  Compliance with treatment is good.    Past Medical History:  Diagnosis Date  . GERD (gastroesophageal reflux disease)   . OCD (obsessive compulsive disorder)     Past Surgical History:  Procedure Laterality Date  . CESAREAN SECTION  1996  . CESAREAN SECTION  2001  . COSMETIC SURGERY  2002   Tummy tuck  . TONSILLECTOMY  1990    Family History  Problem Relation Age of Onset  . Kidney Stones Mother   . Diabetes Father   . Hyperlipidemia Father   . Hypertension Father   . Asthma Son     Social History   Social History  . Marital status: Married    Spouse name: N/A  . Number of children: N/A  .  Years of education: N/A   Occupational History  . Not on file.   Social History Main Topics  . Smoking status: Never Smoker  . Smokeless tobacco: Never Used  . Alcohol use No  . Drug use: No  . Sexual activity: Yes    Partners: Male    Birth control/ protection: Other-see comments     Comment: Husband had a Vasectomy.   Other Topics Concern  . Not on file   Social History Narrative  . No narrative on file     Current Outpatient Prescriptions:  .  citalopram (CELEXA) 40 MG tablet, TAKE 1 TABLET BY MOUTH EVERY DAY, Disp: 90 tablet, Rfl: 0 .  ranitidine (ZANTAC) 150 MG tablet, Take 1 tablet (150 mg total) by mouth 2 (two) times daily., Disp: 180 tablet, Rfl: 0  Allergies  Allergen Reactions  . Ciprofloxacin Hives     Review of Systems  Constitutional: Positive for appetite change and fatigue.  HENT: Negative for sore throat.   Respiratory: Negative for cough.   Gastrointestinal: Positive for heartburn and nausea. Negative for abdominal pain.  Psychiatric/Behavioral: Positive for depression. Negative for suicidal ideas.     Objective  Vitals:   11/06/16 1447  BP: 110/64  Pulse: 96  Weight: 152 lb 12.8 oz (69.3 kg)  Height: 5\' 3"  (1.6 m)  Physical Exam  Constitutional: She is oriented to person, place, and time and well-developed, well-nourished, and in no distress.  HENT:  Head: Normocephalic and atraumatic.  Cardiovascular: Normal rate, regular rhythm and normal heart sounds.   No murmur heard. Pulmonary/Chest: Effort normal and breath sounds normal. She has no wheezes.  Abdominal: Soft. Bowel sounds are normal. There is no tenderness.  Musculoskeletal: She exhibits no edema.  Neurological: She is alert and oriented to person, place, and time.  Psychiatric: Mood, memory, affect and judgment normal.  Nursing note and vitals reviewed.       Assessment & Plan  1. Gastroesophageal reflux disease, esophagitis presence not specified DC Zantac, restart  Protonix at 40 mg, consider referral to gastroenterology if she is still symptomatic - pantoprazole (PROTONIX) 40 MG tablet; Take 1 tablet (40 mg total) by mouth daily.  Dispense: 90 tablet; Refill: 0  2. Mixed obsessional thoughts and acts Continue on citalopram, refills provided - citalopram (CELEXA) 40 MG tablet; Take 1 tablet (40 mg total) by mouth daily.  Dispense: 90 tablet; Refill: 0  3. Moderate episode of recurrent major depressive disorder ([place]) Given her worsening depression along with a OCD symptoms, will add bupropion 100 mg twice daily, follow-up in 3 months - buPROPion (WELLBUTRIN) 100 MG tablet; Take 1 tablet (100 mg total) by mouth 2 (two) times daily.  Dispense: 180 tablet; Refill: 0   Cassia Fein [facility] 11/06/2016 2:52 PM

## 2016-11-10 ENCOUNTER — Ambulatory Visit (INDEPENDENT_AMBULATORY_CARE_PROVIDER_SITE_OTHER): Payer: PRIVATE HEALTH INSURANCE | Admitting: Family Medicine

## 2016-11-10 ENCOUNTER — Encounter: Payer: Self-pay | Admitting: Family Medicine

## 2016-11-10 VITALS — BP 118/72 | HR 92 | Temp 98.8°F | Resp 14 | Wt 153.7 lb

## 2016-11-10 DIAGNOSIS — B029 Zoster without complications: Secondary | ICD-10-CM

## 2016-11-10 MED ORDER — VALACYCLOVIR HCL 1 G PO TABS: 1000.0000 mg | ORAL_TABLET | Freq: Three times a day (TID) | ORAL | 0 refills | Status: DC

## 2016-11-10 MED ORDER — VALACYCLOVIR HCL 1 G PO TABS: 1000.0000 mg | ORAL_TABLET | Freq: Three times a day (TID) | ORAL | 0 refills | Status: AC

## 2016-11-10 NOTE — Progress Notes (Signed)
Name: Gloria Harrison   MRN: [id number]    DOB: Dec 20, 1965   Date:11/10/2016       Progress Note  Subjective  Chief Complaint  Chief Complaint  Patient presents with  . Herpes Zoster    Pt started having some tingling feeling in her right leg lst thurday. Saturday is when th 6 bumps appeared and a rash. Pt states she's treated with anti fungal cream.     HPI  Patient reports appearance of tingling in her right mid thigh followed by rash which is described as blisterlike, she has tried applying antifungal but no relief, she suspects it may be shingles, and that's when she decided to seek attention   Past Medical History:  Diagnosis Date  . GERD (gastroesophageal reflux disease)   . OCD (obsessive compulsive disorder)     Past Surgical History:  Procedure Laterality Date  . CESAREAN SECTION  1996  . CESAREAN SECTION  2001  . COSMETIC SURGERY  2002   Tummy tuck  . TONSILLECTOMY  1990    Family History  Problem Relation Age of Onset  . Kidney Stones Mother   . Diabetes Father   . Hyperlipidemia Father   . Hypertension Father   . Asthma Son   . Suicidality Son     Social History   Social History  . Marital status: Married    Spouse name: N/A  . Number of children: N/A  . Years of education: N/A   Occupational History  . Not on file.   Social History Main Topics  . Smoking status: Never Smoker  . Smokeless tobacco: Never Used  . Alcohol use No  . Drug use: No  . Sexual activity: Yes    Partners: Male    Birth control/ protection: Other-see comments     Comment: Husband had a Vasectomy.   Other Topics Concern  . Not on file   Social History Narrative  . No narrative on file     Current Outpatient Prescriptions:  .  buPROPion (WELLBUTRIN) 100 MG tablet, Take 1 tablet (100 mg total) by mouth 2 (two) times daily., Disp: 180 tablet, Rfl: 0 .  citalopram (CELEXA) 40 MG tablet, Take 1 tablet (40 mg total) by mouth daily., Disp: 90 tablet, Rfl: 0 .   pantoprazole (PROTONIX) 40 MG tablet, Take 1 tablet (40 mg total) by mouth daily., Disp: 90 tablet, Rfl: 0  Allergies  Allergen Reactions  . Ciprofloxacin Hives     Review of Systems  Constitutional: Negative for chills, fever and malaise/fatigue.  Skin: Positive for rash. Negative for itching.      Objective  Vitals:   11/10/16 1511  BP: 118/72  Pulse: 92  Resp: 14  Temp: 98.8 F (37.1 C)  TempSrc: Oral  SpO2: 98%  Weight: 153 lb 11.2 oz (69.7 kg)    Physical Exam  Constitutional: She is oriented to person, place, and time and well-developed, well-nourished, and in no distress.  Musculoskeletal:       Legs: papulo-pustular pruritic painful rash likely c/w zoster, limited to T6 dermatome  Neurological: She is alert and oriented to person, place, and time.  Psychiatric: Mood, memory, affect and judgment normal.  Nursing note and vitals reviewed.         Assessment & Plan  1. Herpes zoster without complication rashes consistent with an outbreak of herpes zoster, started on Valtrex, advised to apply a gentle calamine lotion to the lesions, practice appropriate contact precautions, reassess if not improving -  valACYclovir (VALTREX) 1000 MG tablet; Take 1 tablet (1,000 mg total) by mouth 3 (three) times daily.  Dispense: 21 tablet; Refill: 0   Griffon Herberg [facility]ad [facility] 11/10/2016 3:40 PM

## 2017-01-19 ENCOUNTER — Other Ambulatory Visit: Payer: Self-pay | Admitting: Family Medicine

## 2017-01-19 DIAGNOSIS — F429 Obsessive-compulsive disorder, unspecified: Secondary | ICD-10-CM

## 2017-02-12 DIAGNOSIS — H04123 Dry eye syndrome of bilateral lacrimal glands: Secondary | ICD-10-CM | POA: Diagnosis not present

## 2017-02-19 ENCOUNTER — Encounter: Payer: Self-pay | Admitting: Family Medicine

## 2017-02-19 ENCOUNTER — Ambulatory Visit: Payer: PRIVATE HEALTH INSURANCE | Admitting: Family Medicine

## 2017-02-19 VITALS — BP 100/62 | HR 100 | Temp 98.3°F | Resp 16 | Ht 63.0 in | Wt 151.0 lb

## 2017-02-19 DIAGNOSIS — R109 Unspecified abdominal pain: Secondary | ICD-10-CM | POA: Diagnosis not present

## 2017-02-19 DIAGNOSIS — R1030 Lower abdominal pain, unspecified: Secondary | ICD-10-CM

## 2017-02-19 MED ORDER — DICYCLOMINE HCL 20 MG PO TABS
20.0000 mg | ORAL_TABLET | Freq: Four times a day (QID) | ORAL | 0 refills | Status: DC
Start: 2017-02-19 — End: 2017-06-02

## 2017-02-19 NOTE — Progress Notes (Signed)
Name: Gloria Harrison   MRN: [id number]    DOB: [birth year]   Date:02/19/2017       Progress Note  Subjective  Chief Complaint  Chief Complaint  Patient presents with  . Abdominal Pain    HPI  Pt reports dull, cramping lower abdominal pain for about 1 week.  She has hx kidney stones in the past - does not feel at all similar.  She does have GERD and takes pantoprazole daily - this has been under good control with the medication.  Endorses mild nausea. She is sexually active - is married and declines STI testing today.  She denies vaginal discharge or irritation, abnormal vaginal bleeding, vomiting, diarrhea, constipation, back/flank pain, recent travel, fevers/chills, body aches.  Has never had colonoscopy; no family history colorectal, ovarian or uterine cancers.  Patient Active Problem List   Diagnosis Date Noted  . Tinea corporis 12/04/2015  . Chronic low back pain without sciatica 11/22/2014  . GERD (gastroesophageal reflux disease) 08/09/2014  . OCD (obsessive compulsive disorder) 08/09/2014  . Chronic fatigue 08/09/2014  . 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 Calculus of kidney 08/09/2014  . Neoplasm of skin 08/09/2014    Social History   Tobacco Use  . Smoking status: Never Smoker  . Smokeless tobacco: Never Used  Substance Use Topics  . Alcohol use: No    Alcohol/week: 0.0 oz     Current Outpatient Medications:  .  citalopram (CELEXA) 40 MG tablet, TAKE 1 TABLET BY MOUTH EVERY DAY, Disp: 90 tablet, Rfl: 0 .  pantoprazole (PROTONIX) 40 MG tablet, Take 1 tablet (40 mg total) by mouth daily., Disp: 90 tablet, Rfl: 0 .  buPROPion (WELLBUTRIN) 100 MG tablet, Take 1 tablet (100 mg total) by mouth 2 (two) times daily., Disp: 180 tablet, Rfl: 0  Allergies  Allergen Reactions  . Ciprofloxacin Hives    ROS  Constitutional: Negative for fever or weight change.  Respiratory: Negative for cough and shortness of breath.   Cardiovascular: Negative for chest pain or palpitations.   Gastrointestinal: Positive for abdominal pain, no bowel changes (no blood in stool, dark/tarry stool, mucus stools).  Musculoskeletal: Negative for gait problem or joint swelling.  Skin: Negative for rash.  Neurological: Negative for dizziness or headache.  No other specific complaints in a complete review of systems (except as listed in HPI above).  Objective  Vitals:   02/19/17 1541  BP: 100/62  Pulse: 100  Resp: 16  Temp: 98.3 F (36.8 C)  TempSrc: Oral  SpO2: 97%  Weight: 151 lb (68.5 kg)  Height: 5\' 3"  (1.6 m)   Body mass index is 26.75 kg/m.  Nursing Note and Vital Signs reviewed.  Physical Exam  Constitutional: Patient appears well-developed and well-nourished. Obese. No distress.  HEENT: head atraumatic, normocephalic Cardiovascular: Normal rate, regular rhythm, S1/S2 present.  No murmur or rub heard. No BLE edema. Pulmonary/Chest: Effort normal and breath sounds clear. No respiratory distress or retractions. Abdominal: Soft and mild tenderness to LLQ, bowel sounds present x4 quadrants.  No CVA Tenderness Psychiatric: Patient has a normal mood and affect. behavior is normal. Judgment and thought content normal.  No results found for this or any previous visit (from the past 72 hour(s)).  Assessment & Plan  1. Lower abdominal pain - COMPLETE METABOLIC PANEL WITH GFR - CBC w/Diff/Platelet - H. pylori breath test  2. Abdominal cramping - dicyclomine (BENTYL) 20 MG tablet; Take 1 tablet (20 mg total) by mouth every 6 (six) hours.  Dispense: 20  tablet; Refill: 0 - COMPLETE METABOLIC PANEL WITH GFR - CBC w/Diff/Platelet - H. pylori breath test  -Red flags and when to present for emergency care or RTC including fever >101.82F, chest pain, shortness of breath, new/worsening/un-resolving symptoms, severe abdominal pain, start vomiting, blood in stool   reviewed with patient at time of visit. Follow up and care instructions discussed and provided in AVS.

## 2017-02-19 NOTE — Patient Instructions (Addendum)

## 2017-02-20 LAB — COMPLETE METABOLIC PANEL WITH GFR
AG RATIO: 1.6 (calc) (ref 1.0–2.5)
ALKALINE PHOSPHATASE (APISO): 53 U/L (ref 33–115)
ALT: 11 U/L (ref 6–29)
AST: 10 U/L (ref 10–35)
Albumin: 4.1 g/dL (ref 3.6–5.1)
BILIRUBIN TOTAL: 0.2 mg/dL (ref 0.2–1.2)
BUN/Creatinine Ratio: 12 (calc) (ref 6–22)
BUN: 16 mg/dL (ref 7–25)
CALCIUM: 9.3 mg/dL (ref 8.6–10.2)
CHLORIDE: 102 mmol/L (ref 98–110)
CO2: 28 mmol/L (ref 20–32)
Creat: 1.35 mg/dL — ABNORMAL HIGH (ref 0.50–1.10)
GFR, Est African American: 53 mL/min/{1.73_m2} — ABNORMAL LOW (ref >=60)
GFR, Est Non African American: 46 mL/min/{1.73_m2} — ABNORMAL LOW (ref >=60)
Globulin: 2.5 g/dL (calc) (ref 1.9–3.7)
Glucose, Bld: 101 mg/dL — ABNORMAL HIGH (ref 65–99)
POTASSIUM: 4.2 mmol/L (ref 3.5–5.3)
Sodium: 139 mmol/L (ref 135–146)
Total Protein: 6.6 g/dL (ref 6.1–8.1)

## 2017-02-20 LAB — CBC WITH DIFFERENTIAL/PLATELET
BASOS PCT: 0.7 %
Basophils Absolute: 64 cells/uL (ref 0–200)
EOS PCT: 1.1 %
Eosinophils Absolute: 100 cells/uL (ref 15–500)
HCT: 38.3 % (ref 35.0–45.0)
Hemoglobin: 12.9 g/dL (ref 11.7–15.5)
Lymphs Abs: 1338 cells/uL (ref 850–3900)
MCH: 28.8 pg (ref 27.0–33.0)
MCHC: 33.7 g/dL (ref 32.0–36.0)
MCV: 85.5 fL (ref 80.0–100.0)
MONOS PCT: 7 %
MPV: 10.1 fL (ref 7.5–12.5)
NEUTROS ABS: 6962 {cells}/uL (ref 1500–7800)
Neutrophils Relative %: 76.5 %
PLATELETS: 276 10*3/uL (ref 140–400)
RBC: 4.48 10*6/uL (ref 3.80–5.10)
RDW: 13 % (ref 11.0–15.0)
TOTAL LYMPHOCYTE: 14.7 %
WBC mixed population: 637 cells/uL (ref 200–950)
WBC: 9.1 10*3/uL (ref 3.8–10.8)

## 2017-02-20 LAB — H. PYLORI BREATH TEST: H. PYLORI BREATH TEST: NOT DETECTED

## 2017-02-23 ENCOUNTER — Encounter: Payer: Self-pay | Admitting: Family Medicine

## 2017-02-23 ENCOUNTER — Ambulatory Visit: Payer: 59 | Admitting: Family Medicine

## 2017-02-23 ENCOUNTER — Ambulatory Visit
Admission: RE | Admit: 2017-02-23 | Discharge: 2017-02-23 | Disposition: A | Discharge status: Home / Self Care | Payer: 59 | Source: Ambulatory Visit | Attending: Family Medicine | Admitting: Family Medicine

## 2017-02-23 VITALS — BP 122/70 | HR 100 | Temp 98.0°F | Resp 16 | Ht 63.0 in | Wt 151.2 lb

## 2017-02-23 DIAGNOSIS — Z124 Encounter for screening for malignant neoplasm of cervix: Secondary | ICD-10-CM | POA: Diagnosis not present

## 2017-02-23 DIAGNOSIS — R1032 Left lower quadrant pain: Principal | ICD-10-CM

## 2017-02-23 DIAGNOSIS — R1031 Right lower quadrant pain: Secondary | ICD-10-CM | POA: Insufficient documentation

## 2017-02-23 DIAGNOSIS — R109 Unspecified abdominal pain: Secondary | ICD-10-CM | POA: Diagnosis not present

## 2017-02-23 DIAGNOSIS — R944 Abnormal results of kidney function studies: Secondary | ICD-10-CM

## 2017-02-23 LAB — HEMOCCULT GUIAC POC 1CARD (OFFICE): Fecal Occult Blood, POC: NEGATIVE

## 2017-02-23 NOTE — Progress Notes (Signed)
Name: Gloria Harrison   MRN: [id number]    DOB: 02/24/1965   Date:02/23/2017       Progress Note  Subjective  Chief Complaint  Chief Complaint  Patient presents with  . Abdominal Pain  . abnormal labs    HPI  Pt presents to follow up on BLQ abdominal pain and decreased kidney function.  She was seen 02/19/2017 - had normal CBC, negative H. Pylori, Creatinine was 1.35, GFR 46.  She reports Bentyl did not help her pain; pain has remained stable, has not worsened or improved.  She denies vaginal discharge or irritation, abnormal vaginal bleeding, nausea, vomiting, diarrhea, constipation, back/flank pain, recent travel, fevers/chills, body aches.    - She does note history of kidney stones - used to see Dr. Rogers Blocker - saw him about a year ago - we will check urine again today.  - Has never had colonoscopy; no family history colorectal, ovarian or uterine cancers.   - Used to see [facility] - has not had Pap since 2015.  Periods have been normal - LMP 02/02/2017, lasted for 5 days, was her normal flow.  Husband had vasectomy w/ follow up confirmation - no other form of birth control.  Is monogamous with husband, but is willing to have Gonorrhea/chlamydia testing as part of work-up.  Patient Active Problem List   Diagnosis Date Noted  . Tinea corporis 12/04/2015  . Chronic low back pain without sciatica 11/22/2014  . GERD (gastroesophageal reflux disease) 08/09/2014  . OCD (obsessive compulsive disorder) 08/09/2014  . Chronic fatigue 08/09/2014  . 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 Calculus of kidney 08/09/2014  . Neoplasm of skin 08/09/2014    Past Surgical History:  Procedure Laterality Date  . CESAREAN SECTION  1996  . CESAREAN SECTION  2001  . COSMETIC SURGERY  2002   Tummy tuck  . TONSILLECTOMY  1990    Family History  Problem Relation Age of Onset  . Kidney Stones Mother   . Diabetes Father   . Hyperlipidemia Father   . Hypertension Father   . Asthma Son   . Suicidality  Son     Social History   Socioeconomic History  . Marital status: Married    Spouse name: Not on file  . Number of children: Not on file  . Years of education: Not on file  . Highest education level: Not on file  Social Needs  . Financial resource strain: Not on file  . Food insecurity - worry: Not on file  . Food insecurity - inability: Not on file  . Transportation needs - medical: Not on file  . Transportation needs - non-medical: Not on file  Occupational History  . Not on file  Tobacco Use  . Smoking status: Never Smoker  . Smokeless tobacco: Never Used  Substance and Sexual Activity  . Alcohol use: No    Alcohol/week: 0.0 oz  . Drug use: No  . Sexual activity: Yes    Partners: Male    Birth control/protection: Other-see comments    Comment: Husband had a Vasectomy.  Other Topics Concern  . Not on file  Social History Narrative  . Not on file     Current Outpatient Medications:  .  citalopram (CELEXA) 40 MG tablet, TAKE 1 TABLET BY MOUTH EVERY DAY, Disp: 90 tablet, Rfl: 0 .  dicyclomine (BENTYL) 20 MG tablet, Take 1 tablet (20 mg total) by mouth every 6 (six) hours., Disp: 20 tablet, Rfl: 0 .  pantoprazole (  PROTONIX) 40 MG tablet, Take 1 tablet (40 mg total) by mouth daily., Disp: 90 tablet, Rfl: 0 .  buPROPion (WELLBUTRIN) 100 MG tablet, Take 1 tablet (100 mg total) by mouth 2 (two) times daily., Disp: 180 tablet, Rfl: 0  Allergies  Allergen Reactions  . Ciprofloxacin Hives    ROS Constitutional: Negative for fever or weight change.  Respiratory: Negative for cough and shortness of breath.   Cardiovascular: Negative for chest pain or palpitations.  Gastrointestinal: See HPI; no bowel changes.  Musculoskeletal: Negative for gait problem or joint swelling.  Skin: Negative for rash.  Neurological: Negative for dizziness or headache.  No other specific complaints in a complete review of systems (except as listed in HPI above).  Objective  Vitals:    02/23/17 1507  BP: 122/70  Pulse: 100  Resp: 16  Temp: 98 F (36.7 C)  TempSrc: Oral  SpO2: 98%  Weight: 151 lb 3.2 oz (68.6 kg)  Height: 5\' 3"  (1.6 m)   Body mass index is 26.78 kg/m.  Physical Exam Constitutional: Patient appears well-developed and well-nourished. No distress.  HENT: Head: Normocephalic and atraumatic. Neck: Normal range of motion. Neck supple. No JVD present.  Cardiovascular: Normal rate, regular rhythm and normal heart sounds.  No murmur heard. No BLE edema. Pulmonary/Chest: Effort normal and breath sounds normal. No respiratory distress. Abdominal: Soft. Bowel sounds are normal, no distension. There is no tenderness or rebound. no masses, no CVA tenderness. Breast: no lumps or masses, no nipple discharge or rashes FEMALE GENITALIA:  External genitalia normal External urethra normal Vaginal vault normal without discharge or lesions Cervix normal without discharge or lesions; no CMT Bimanual exam normal without masses RECTAL: no rectal masses; negative hemoccult. Very small hemorrhoid in the 6:00 position that is flesh colored, non-thrombosed, non-friable, non-tender to palpation. Musculoskeletal: Normal range of motion, no joint effusions. No gross deformities Neurological: she is alert and oriented to person, place, and time. No cranial nerve deficit. Coordination, balance, strength, speech and gait are normal.  Skin: Skin is warm and dry. No rash noted. No erythema.  Psychiatric: Patient has a normal mood and affect. behavior is normal. Judgment and thought content normal.  Results for orders placed or performed in visit on 02/23/17 (from the past 72 hour(s))  POCT Occult Blood Stool     Status: Normal   Collection Time: 02/23/17  4:51 PM  Result Value Ref Range   Fecal Occult Blood, POC Negative Negative    PHQ2/9: Depression screen [facility] 2/9 11/10/2016 11/06/2016 12/04/2015 06/08/2015 02/02/2015  Decreased Interest 0 1 0 0 0  Down, Depressed, Hopeless 0  2 0 0 0  PHQ - 2 Score 0 3 0 0 0  Altered sleeping 0 1 - - -  Tired, decreased energy 0 3 - - -  Change in appetite 0 1 - - -  Feeling bad or failure about yourself  0 1 - - -  Trouble concentrating 0 1 - - -  Moving slowly or fidgety/restless 0 1 - - -  Suicidal thoughts 0 0 - - -  PHQ-9 Score 0 11 - - -    Fall Risk: Fall Risk  11/10/2016 11/06/2016 12/04/2015 06/08/2015 02/02/2015  Falls in the past year? No No No No No   Assessment & Plan   1. Abdominal pain, bilateral lower quadrant - Urinalysis, microscopic only - Urine Culture - DG Abd 1 View; Future - C. trachomatis/N. gonorrhoeae RNA - POCT Occult Blood Stool - WET PREP BY  MOLECULAR PROBE - Pap IG and HPV (high risk) DNA detection - COMPLETE METABOLIC PANEL WITH GFR  2. Cervical cancer screening - Pap IG and HPV (high risk) DNA detection  3. Decreased GFR - COMPLETE METABOLIC PANEL WITH GFR - We will recheck this today.  If kidney function remains abnormal, will consider referral to Nephrology.  Return for Schedule CPE at pt's convenience; follow up based on results.Marland Kitchen

## 2017-02-24 LAB — URINALYSIS, MICROSCOPIC ONLY
Bacteria, UA: NONE SEEN /HPF
Hyaline Cast: NONE SEEN /LPF
RBC / HPF: NONE SEEN /HPF (ref 0–2)

## 2017-02-24 LAB — WET PREP BY MOLECULAR PROBE
Candida species: NOT DETECTED
MICRO NUMBER:: 90118145
SPECIMEN QUALITY:: ADEQUATE
Trichomonas vaginosis: NOT DETECTED

## 2017-02-24 LAB — URINE CULTURE
MICRO NUMBER:: 90117794
SPECIMEN QUALITY:: ADEQUATE

## 2017-02-24 LAB — COMPLETE METABOLIC PANEL WITHOUT GFR
AG Ratio: 1.6 (calc) (ref 1.0–2.5)
ALT: 9 U/L (ref 6–29)
AST: 9 U/L — ABNORMAL LOW (ref 10–35)
Albumin: 4.1 g/dL (ref 3.6–5.1)
Alkaline phosphatase (APISO): 50 U/L (ref 33–115)
BUN: 10 mg/dL (ref 7–25)
CO2: 28 mmol/L (ref 20–32)
Calcium: 9.2 mg/dL (ref 8.6–10.2)
Chloride: 104 mmol/L (ref 98–110)
Creat: 0.92 mg/dL (ref 0.50–1.10)
GFR, Est African American: 85 mL/min/{1.73_m2}
GFR, Est Non African American: 73 mL/min/{1.73_m2}
Globulin: 2.6 g/dL (ref 1.9–3.7)
Glucose, Bld: 119 mg/dL — ABNORMAL HIGH (ref 65–99)
Potassium: 3.6 mmol/L (ref 3.5–5.3)
Sodium: 138 mmol/L (ref 135–146)
Total Bilirubin: 0.2 mg/dL (ref 0.2–1.2)
Total Protein: 6.7 g/dL (ref 6.1–8.1)

## 2017-02-25 ENCOUNTER — Other Ambulatory Visit: Payer: Self-pay | Admitting: Family Medicine

## 2017-02-25 ENCOUNTER — Telehealth: Payer: Self-pay | Admitting: Family Medicine

## 2017-02-25 DIAGNOSIS — K5900 Constipation, unspecified: Secondary | ICD-10-CM

## 2017-02-25 DIAGNOSIS — N760 Acute vaginitis: Principal | ICD-10-CM

## 2017-02-25 DIAGNOSIS — B9689 Other specified bacterial agents as the cause of diseases classified elsewhere: Secondary | ICD-10-CM

## 2017-02-25 LAB — PAP IG AND HPV HIGH-RISK: HPV DNA HIGH RISK: NOT DETECTED

## 2017-02-25 LAB — C. TRACHOMATIS/N. GONORRHOEAE RNA
C. trachomatis RNA, TMA: NOT DETECTED
N. GONORRHOEAE RNA, TMA: NOT DETECTED

## 2017-02-25 MED ORDER — METRONIDAZOLE 500 MG PO TABS: 500.0000 mg | ORAL_TABLET | Freq: Two times a day (BID) | ORAL | 0 refills | Status: AC

## 2017-02-25 MED ORDER — POLYETHYLENE GLYCOL 3350 17 GM/SCOOP PO POWD: 17.0000 g | Freq: Every day | ORAL | 1 refills | Status: DC

## 2017-02-25 NOTE — Telephone Encounter (Signed)
Copied from [place] [phone]. Topic: Quick Communication - See Telephone Encounter >> Feb 25, 2017 10:21 AM Bea Graff, NT wrote: CRM for notification. See Telephone encounter for: Pt calling office back to get results from 02/23/17. The CRM in pts chart does not give permission for [facility] nurse triage to release the labs. Needs to say in CRM ok for [facility] NT to release results and please put in result note per nurse triage. Please call pt.   02/25/17.

## 2017-02-25 NOTE — Telephone Encounter (Signed)
Sent CRM for them to triage and release lab results

## 2017-02-25 NOTE — Telephone Encounter (Signed)
Called/left message for patient to call back.

## 2017-02-26 ENCOUNTER — Telehealth: Payer: Self-pay | Admitting: Family Medicine

## 2017-02-26 NOTE — Telephone Encounter (Signed)
Please advise 

## 2017-02-26 NOTE — Telephone Encounter (Signed)
Phleboliths can contribute to a concern for kidney stones in the right setting.  Her urine microscopic showed no blood, and her examination showed no tenderness over the kidneys or along her flank - all of which is reassuring that there is no obstructing stone. In addition, her kidney function returned to normal a few days after her initial abnormal test.   That being said, if she would like to see her urologist, I am happy to send in a referral.  At this time I recommend she complete Flagyl treatment for BV and monitor her symptoms. If not improving with treatment, or if at any point her symptoms are worsening, she needs to call and let us know.

## 2017-02-26 NOTE — Telephone Encounter (Signed)
Pt states that she saw the xray report done 02/23/17 and was concerned about the possibility of kidney stones causing her pain. Pt asking if she needs a referral to Dr Rogers Blocker her urologist. Pt states she has h/o stones.

## 2017-02-26 NOTE — Telephone Encounter (Signed)
Pt given results per notes of Raelyn Ensign NP on 02/25/17.Unable to document in result note due to result note not being routed to [facility].

## 2017-02-26 NOTE — Telephone Encounter (Signed)
Patient notified, will call back if she want a referral to Urologist

## 2017-02-27 ENCOUNTER — Ambulatory Visit: Payer: 59 | Admitting: Family Medicine

## 2017-02-27 DIAGNOSIS — H04123 Dry eye syndrome of bilateral lacrimal glands: Secondary | ICD-10-CM | POA: Diagnosis not present

## 2017-04-16 ENCOUNTER — Telehealth: Payer: Self-pay | Admitting: Family Medicine

## 2017-04-16 DIAGNOSIS — K219 Gastro-esophageal reflux disease without esophagitis: Secondary | ICD-10-CM

## 2017-04-16 NOTE — Telephone Encounter (Signed)
Copied from [place] [phone]. Topic: Quick Communication - Rx Refill/Question >> Apr 16, 2017  4:26 PM Neva Seat wrote:  pantoprazole (PROTONIX) 40 MG tablet   Faxed request for refills on [facility] [email or web address] [facility] in [place], [place] [address]

## 2017-04-16 NOTE — Telephone Encounter (Signed)
Refill  Request    Protonix

## 2017-04-17 MED ORDER — PANTOPRAZOLE SODIUM 40 MG PO TBEC: 40.0000 mg | DELAYED_RELEASE_TABLET | Freq: Every day | ORAL | 0 refills | Status: DC

## 2017-04-17 NOTE — Telephone Encounter (Signed)
LVM for pt to call the office and schedule an appt

## 2017-04-17 NOTE — Telephone Encounter (Signed)
Will this be a one time script or do you want to continue to refill

## 2017-04-17 NOTE — Telephone Encounter (Signed)
I have not yet seen Gloria Harrison for her chronic medical problems. Please schedule an appointment for follow up.  I have sent in 30-day supply of her Protonix to allow her time to come in and see me. Thanks!

## 2017-05-11 ENCOUNTER — Telehealth: Payer: Self-pay | Admitting: Family Medicine

## 2017-05-11 NOTE — Telephone Encounter (Signed)
Copied from [place] [phone]. Topic: Quick Communication - See Telephone Encounter >> May 11, 2017  2:19 PM Cleaster Corin, NT wrote: CRM for notification. See Telephone encounter for: 05/11/17. Pt. Would like medical records sent over to [facility] Fax # [phone] release

## 2017-05-11 NOTE — Telephone Encounter (Signed)
Left patient a voice message letting her know that we are on the same EMR system as [place] therefore they would be able to see everything that was done in our office.

## 2017-05-14 ENCOUNTER — Ambulatory Visit: Payer: 59 | Admitting: Family Medicine

## 2017-05-14 DIAGNOSIS — M25431 Effusion, right wrist: Secondary | ICD-10-CM | POA: Diagnosis not present

## 2017-05-17 ENCOUNTER — Other Ambulatory Visit: Payer: Self-pay | Admitting: Family Medicine

## 2017-05-17 DIAGNOSIS — K219 Gastro-esophageal reflux disease without esophagitis: Secondary | ICD-10-CM

## 2017-06-02 ENCOUNTER — Ambulatory Visit (INDEPENDENT_AMBULATORY_CARE_PROVIDER_SITE_OTHER): Payer: 59 | Admitting: Family Medicine

## 2017-06-02 ENCOUNTER — Encounter: Payer: Self-pay | Admitting: Family Medicine

## 2017-06-02 VITALS — BP 117/53 | HR 99 | Temp 98.4°F | Resp 16 | Ht 63.0 in | Wt 149.0 lb

## 2017-06-02 DIAGNOSIS — F428 Other obsessive-compulsive disorder: Secondary | ICD-10-CM | POA: Diagnosis not present

## 2017-06-02 DIAGNOSIS — K219 Gastro-esophageal reflux disease without esophagitis: Secondary | ICD-10-CM

## 2017-06-02 DIAGNOSIS — F411 Generalized anxiety disorder: Secondary | ICD-10-CM | POA: Diagnosis not present

## 2017-06-02 DIAGNOSIS — E559 Vitamin D deficiency, unspecified: Secondary | ICD-10-CM

## 2017-06-02 DIAGNOSIS — R5382 Chronic fatigue, unspecified: Secondary | ICD-10-CM | POA: Diagnosis not present

## 2017-06-02 DIAGNOSIS — F3342 Major depressive disorder, recurrent, in full remission: Secondary | ICD-10-CM

## 2017-06-02 DIAGNOSIS — Z7689 Persons encountering health services in other specified circumstances: Secondary | ICD-10-CM | POA: Diagnosis not present

## 2017-06-02 MED ORDER — PANTOPRAZOLE SODIUM 40 MG PO TBEC: 40.0000 mg | DELAYED_RELEASE_TABLET | Freq: Every day | ORAL | 1 refills | Status: DC

## 2017-06-02 MED ORDER — CITALOPRAM HYDROBROMIDE 40 MG PO TABS: 40.0000 mg | ORAL_TABLET | Freq: Every day | ORAL | 1 refills | Status: DC

## 2017-06-02 NOTE — Progress Notes (Signed)
Subjective:    Patient ID: Gloria Harrison, female    DOB: 1965/12/11, 52 y.o.   MRN: [id number]  Gloria Harrison is a 52 y.o. female presenting on 06/02/2017 for Establish Care (needs refill for acid reflux); Gastroesophageal Reflux; and Fatigue  Previously established at [facility], now switch PCP to our office by preference after previous PCP left practice. Her son Gloria Harrison) also is my patient at our practice here [facility].  HPI   Additional updates - Recent work-up 01/2017 for lower abdominal pain, from prior PCP, had extensive testing, labs, imaging, pap and STD screening, ultimately dx with BV and phleboliths, cannot rule out nephrolithiasis. She was treated with Flagyl. - Seems to have improved without recurrence - History of lasik surgery many years ago, and now has dry eyes, uses eye drops  Fatigue / Reduced Energy / Abnormal Elevated Glucose / Vit D Def Relatively new concern with reduced energy and fatigue, asking if this is related to age lifestyle or other factors. She thinks some of it is due to stress primarily with OCD and mood and adjustment with her son, see below. Asking about other factors lab tests. Recent labs showed normal Hemoglobin. Prior TSH and Vitamin B12 in few years ago were normal. History of mild low Vitamin D. Prior labs with elevated glucose, on non fasting lab. No prior A1c in chart. - Fam history of hyper/hypothyroidism  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 Reports chronic history >15-20 years, previous dx by psychiatry, and ultimately her PCP was managing meds, describes symptom mostly intrusive thoughts that are related to anxieties for her and worry - Prior history mental hospital x 1 week, 15 year ago, initial dx - Previously saw Psych and therapy - In past saw Sena Hitch therapist/counselor - [facility] ([place]), she can return when needed Failed Wellbutrin in past from 2018, not effective Admits  mood can be down due to adjustment with life stressors, now her son is living back in area again and she is trying to adjust to this after his history of depression as well - Taking Celexa 40mg  daily, request refill  GERD Chronic problem, worse with stress Well controlled Taking Protonix 40mg  daily, request refill  History of kidney stones Previously followed by Dr Yves Dill [facility]), last stone >4 yr ago lithotripsy x 2. Recent work-up not suggestive of kidney stone.  Health Maintenance: Due for routine HIV screening, not checked with recent STD screen labs  UTD Pap smear, 02/23/17 - negative for malignancy or abnormal cells, and negative high risk HPV  UTD Influenza vaccine and TDap  Depression screen [facility] 2/9 06/02/2017 11/10/2016 11/06/2016  Decreased Interest 0 0 1  Down, Depressed, Hopeless 0 0 2  PHQ - 2 Score 0 0 3  Altered sleeping 1 0 1  Tired, decreased energy 1 0 3  Change in appetite 0 0 1  Feeling bad or failure about yourself  1 0 1  Trouble concentrating 0 0 1  Moving slowly or fidgety/restless 0 0 1  Suicidal thoughts 0 0 0  PHQ-9 Score 3 0 11  Difficult doing work/chores Not difficult at all - -   GAD 7 : Generalized Anxiety Score 06/02/2017  Nervous, Anxious, on Edge 1  Control/stop worrying 1  Worry too much - different things 1  Trouble relaxing 1  Restless 1  Easily annoyed or irritable 1  Afraid - awful might happen 1  Total GAD 7 Score 7  Anxiety Difficulty  Somewhat difficult     Past Medical History:  Diagnosis Date  . Chronic kidney disease   . GERD (gastroesophageal reflux disease)   . OCD (obsessive compulsive disorder)    Past Surgical History:  Procedure Laterality Date  . CESAREAN SECTION  1996  . CESAREAN SECTION  2001  . COSMETIC SURGERY  2002   Tummy tuck  . TONSILLECTOMY  1990   Social History   Socioeconomic History  . Marital status: Married    Spouse name: Not on file  . Number of children: Not on file  . Years of  education: Not on file  . Highest education level: Not on file  Occupational History  . Occupation: Special educational needs teacher  Social Needs  . Financial resource strain: Not on file  . Food insecurity:    Worry: Not on file    Inability: Not on file  . Transportation needs:    Medical: Not on file    Non-medical: Not on file  Tobacco Use  . Smoking status: Never Smoker  . Smokeless tobacco: Never Used  Substance and Sexual Activity  . Alcohol use: No    Alcohol/week: 0.0 oz  . Drug use: No  . Sexual activity: Yes    Partners: Male    Birth control/protection: Other-see comments    Comment: Husband had a Vasectomy.  Lifestyle  . Physical activity:    Days per week: Not on file    Minutes per session: Not on file  . Stress: Not on file  Relationships  . Social connections:    Talks on phone: Not on file    Gets together: Not on file    Attends religious service: Not on file    Active member of club or organization: Not on file    Attends meetings of clubs or organizations: Not on file    Relationship status: Not on file  . Intimate partner violence:    Fear of current or ex partner: Not on file    Emotionally abused: Not on file    Physically abused: Not on file    Forced sexual activity: Not on file  Other Topics Concern  . Not on file  Social History Narrative  . Not on file   Family History  Problem Relation Age of Onset  . Kidney Stones Mother   . Diabetes Father   . Hyperlipidemia Father   . Hypertension Father   . Asthma Son   . Suicidality Son   . Depression Son   . Hypothyroidism Sister   . Colon cancer Neg Hx    Current Outpatient Medications on File Prior to Visit  Medication Sig  . XIIDRA 5 % SOLN    No current facility-administered medications on file prior to visit.     Review of Systems  Constitutional: Negative for activity change, appetite change, chills, diaphoresis, fatigue and fever.  HENT: Negative for congestion and hearing loss.     Eyes: Negative for visual disturbance.  Respiratory: Negative for cough, chest tightness, shortness of breath and wheezing.   Cardiovascular: Negative for chest pain, palpitations and leg swelling.  Gastrointestinal: Negative for abdominal pain, constipation, diarrhea, nausea and vomiting.  Genitourinary: Negative for dysuria, frequency and hematuria.  Musculoskeletal: Negative for arthralgias and neck pain.  Skin: Negative for rash.  Neurological: Negative for dizziness, weakness, light-headedness, numbness and headaches.  Hematological: Negative for adenopathy.  Psychiatric/Behavioral: Negative for behavioral problems, dysphoric mood (Controlled) and sleep disturbance. The patient is nervous/anxious (improving).  Per HPI unless specifically indicated above     Objective:    BP (!) 117/53   Pulse 99   Temp 98.4 F (36.9 C) (Oral)   Resp 16   Ht 5\' 3"  (1.6 m)   Wt 149 lb (67.6 kg)   BMI 26.39 kg/m   Wt Readings from Last 3 Encounters:  06/02/17 149 lb (67.6 kg)  02/23/17 151 lb 3.2 oz (68.6 kg)  02/19/17 151 lb (68.5 kg)    Physical Exam  Constitutional: She is oriented to person, place, and time. She appears well-developed and well-nourished. No distress.  Well-appearing, comfortable, cooperative  HENT:  Head: Normocephalic and atraumatic.  Mouth/Throat: Oropharynx is clear and moist.  Eyes: Conjunctivae are normal. Right eye exhibits no discharge. Left eye exhibits no discharge.  Cardiovascular: Normal rate.  Pulmonary/Chest: Effort normal.  Musculoskeletal: She exhibits no edema.  Neurological: She is alert and oriented to person, place, and time.  Skin: Skin is warm and dry. No rash noted. She is not diaphoretic. No erythema.  Psychiatric: She has a normal mood and affect. Her behavior is normal.  Well groomed, good eye contact, normal speech and thoughts. Appears mildly anxious depending on subject with life stressors and her son, calm and appropriate otherwise,  good insight into her health and past mental health history  Nursing note and vitals reviewed.  Results for orders placed or performed in visit on 02/23/17  Urine Culture  Result Value Ref Range   MICRO NUMBER: [id number]    SPECIMEN QUALITY: ADEQUATE    Sample Source URINE    STATUS: FINAL    ISOLATE 1:      Multiple organisms present, each less than 10,000 CFU/mL. These organisms, commonly found on external and internal genitalia, are considered to be colonizers. No further testing performed.  WET PREP BY MOLECULAR PROBE  Result Value Ref Range   MICRO NUMBER: [id number]    SPECIMEN QUALITY: ADEQUATE    SOURCE: GENITAL    STATUS: FINAL    Trichomonas vaginosis Not Detected    Gardnerella vaginalis (A)     Detected. Increased levels of G. vaginalis may not be significant in the absence of signs and symptoms of bacterial vaginosis.   Candida species Not Detected   C. trachomatis/N. gonorrhoeae RNA  Result Value Ref Range   C. trachomatis RNA, TMA NOT DETECTED NOT DETECT   N. gonorrhoeae RNA, TMA NOT DETECTED NOT DETECT  COMPLETE METABOLIC PANEL WITH GFR  Result Value Ref Range   Glucose, Bld 119 (H) 65 - 99 mg/dL   BUN 10 7 - 25 mg/dL   Creat 0.92 0.50 - 1.10 mg/dL   GFR, Est Non African American 73 > OR = 60 mL/min/1.71m2   GFR, Est African American 85 > OR = 60 mL/min/1.37m2   BUN/Creatinine Ratio NOT APPLICABLE 6 - 22 (calc)   Sodium 138 135 - 146 mmol/L   Potassium 3.6 3.5 - 5.3 mmol/L   Chloride 104 98 - 110 mmol/L   CO2 28 20 - 32 mmol/L   Calcium 9.2 8.6 - 10.2 mg/dL   Total Protein 6.7 6.1 - 8.1 g/dL   Albumin 4.1 3.6 - 5.1 g/dL   Globulin 2.6 1.9 - 3.7 g/dL (calc)   AG Ratio 1.6 1.0 - 2.5 (calc)   Total Bilirubin 0.2 0.2 - 1.2 mg/dL   Alkaline phosphatase (APISO) 50 33 - 115 U/L   AST 9 (L) 10 - 35 U/L   ALT 9 6 - 29 U/L  Urine Microscopic  Result Value Ref Range   WBC, UA 0-5 0 - 5 /HPF   RBC / HPF NONE SEEN 0 - 2 /HPF   Squamous Epithelial / LPF 0-5 < OR = 5  /HPF   Bacteria, UA NONE SEEN NONE SEEN /HPF   Hyaline Cast NONE SEEN NONE SEEN /LPF  POCT Occult Blood Stool  Result Value Ref Range   Fecal Occult Blood, POC Negative Negative  Pap IG and HPV (high risk) DNA detection  Result Value Ref Range   Clinical Information:     LMP:     PREV. PAP:     PREV. BX:     HPV DNA Probe-Source     STATEMENT OF ADEQUACY:     INTERPRETATION/RESULT:     Comment:     CYTOTECHNOLOGIST:     HPV DNA High Risk Not Detected Not Detect      Assessment & Plan:   Problem List Items Addressed This Visit    Chronic fatigue    Previous diagnosis Suspect multifactorial, difficult to clearly diagnose today after just meeting patient. Likely mental health emotional strain big part of it, may have reduced sleep - Possibly with labs, due for A1c, TSH, VIt D and Vit B12 as possibilities as well - Follow-up      GAD (generalized anxiety disorder)    Secondary to OCD, depression - Primary symptom of OCD Mostly controlled on Celexa 40mg  daily - refill today Encourage continue with counselor / therapist      Relevant Medications   citalopram (CELEXA) 40 MG tablet   GERD (gastroesophageal reflux disease)    Stable, controlled Refill Protonix 40mg  daily      Relevant Medications   pantoprazole (PROTONIX) 40 MG tablet   Moderate episode of recurrent major depressive disorder (HCC)    Controlled, on SSRI Celexa, refill Secondary to anxiety/OCD      Relevant Medications   citalopram (CELEXA) 40 MG tablet   OCD (obsessive compulsive disorder) - Primary    Stable currently, chronic history of OCD with mixed depression/anxiety, primarily intrusive thoughts - able to function overall at home and work Controlled on SSRI Celexa 40mg  daily, refilled No longer followed by Psychiatry Followed by counselor/therapist - will return      Relevant Medications   citalopram (CELEXA) 40 MG tablet   Vitamin D deficiency    Will check future Vit D lab for her history  of fatigue and past low reading       Other Visit Diagnoses    Encounter to establish care with new doctor        Review records from outside PCP    Meds ordered this encounter  Medications  . citalopram (CELEXA) 40 MG tablet    Sig: Take 1 tablet (40 mg total) by mouth daily.    Dispense:  90 tablet    Refill:  1  . pantoprazole (PROTONIX) 40 MG tablet    Sig: Take 1 tablet (40 mg total) by mouth daily before breakfast.    Dispense:  90 tablet    Refill:  1    Follow up plan: Return in about 1 month (around 06/30/2017) for Annual Physical.   Future lab orders for 06/2017  [name], [facility] 06/03/2017, 1:59 AM

## 2017-06-02 NOTE — Patient Instructions (Addendum)
Thank you for coming to the office today.  Refilled both meds today  Drink plenty of water  Try to improve regular exercise cardio to help energy  Sleep Hygiene Recommendations to promote healthy sleep in all patients, especially if symptoms of insomnia are worsening. Due to the nature of sleep rhythms, if your body gets "out of rhythm", it may take some time before your sleep cycle can be "reset".  Please try to follow as many of the following tips as you can, usually there are only a few of these are the primary cause of the problem.  ?To reset your sleep rhythm, go to bed and get up at the same time every day ?Sleep only long enough to feel rested and then get out of bed ?Do not try to force yourself to sleep. If you can't sleep, get out of bed and try again later. ?Avoid naps during the day, unless excessively tired. The more sleeping during the day, then the less sleep your body needs at night.  ?Have coffee, tea, and other foods that have caffeine only in the morning ?Exercise several days a week, but not right before bed ?If you drink alcohol, prefer to have appropriate drink with one meal, but prefer to avoid alcohol in the evening, and bedtime ?If you smoke, avoid smoking, especially in the evening  ?Avoid watching TV or looking at phones, computers, or reading devices ("e-books") that give off light at least 30 minutes before bed. This artificial light sends "awake signals" to your brain and can make it harder to fall asleep. ?Make your bedroom a comfortable place where it is easy to fall asleep: ? Put up shades or special blackout curtains to block light from outside. ? Use a white noise machine to block noise. ? Keep the temperature cool. ?Try your best to solve or at least address your problems before you go to bed ?Use relaxation techniques to manage stress. Ask your health care provider to suggest some techniques that may work well for you. These may include: ? Breathing  exercises. ? Routines to release muscle tension. ? Visualizing peaceful scenes.    DUE for FASTING BLOOD WORK (no food or drink after midnight before the lab appointment, only water or coffee without cream/sugar on the morning of)  SCHEDULE "Lab Only" visit in the morning at the clinic for lab draw in 4 WEEKS   - Make sure Lab Only appointment is at about 1 week before your next appointment, so that results will be available  For Lab Results, once available within 2-3 days of blood draw, you can can log in to MyChart online to view your results and a brief explanation. Also, we can discuss results at next follow-up visit.   Please schedule a Follow-up Appointment to: Return in about 1 month (around 06/30/2017) for Annual Physical.  If you have any other questions or concerns, please feel free to call the office or send a message through [place]. You may also schedule an earlier appointment if necessary.  Additionally, you may be receiving a survey about your experience at our office within a few days to 1 week by e-mail or mail. We value your feedback.  [name], DO [place]

## 2017-06-03 ENCOUNTER — Other Ambulatory Visit: Payer: Self-pay | Admitting: Family Medicine

## 2017-06-03 ENCOUNTER — Encounter: Payer: Self-pay | Admitting: Family Medicine

## 2017-06-03 DIAGNOSIS — E559 Vitamin D deficiency, unspecified: Secondary | ICD-10-CM | POA: Insufficient documentation

## 2017-06-03 DIAGNOSIS — Z0000 Encounter for general adult medical examination without abnormal findings: Secondary | ICD-10-CM

## 2017-06-03 DIAGNOSIS — F331 Major depressive disorder, recurrent, moderate: Secondary | ICD-10-CM

## 2017-06-03 DIAGNOSIS — F428 Other obsessive-compulsive disorder: Secondary | ICD-10-CM

## 2017-06-03 DIAGNOSIS — R7309 Other abnormal glucose: Secondary | ICD-10-CM

## 2017-06-03 DIAGNOSIS — F411 Generalized anxiety disorder: Secondary | ICD-10-CM

## 2017-06-03 DIAGNOSIS — K219 Gastro-esophageal reflux disease without esophagitis: Secondary | ICD-10-CM

## 2017-06-03 DIAGNOSIS — R5382 Chronic fatigue, unspecified: Secondary | ICD-10-CM

## 2017-06-03 DIAGNOSIS — Z114 Encounter for screening for human immunodeficiency virus [HIV]: Secondary | ICD-10-CM

## 2017-06-03 DIAGNOSIS — E538 Deficiency of other specified B group vitamins: Secondary | ICD-10-CM

## 2017-06-03 NOTE — Assessment & Plan Note (Addendum)
Will check future Vit D lab for her history of fatigue and past low reading

## 2017-06-03 NOTE — Assessment & Plan Note (Signed)
Controlled, on SSRI Celexa, refill Secondary to anxiety/OCD

## 2017-06-03 NOTE — Assessment & Plan Note (Signed)
Stable, controlled Refill Protonix 40mg daily 

## 2017-06-03 NOTE — Assessment & Plan Note (Signed)
Secondary to OCD, depression - Primary symptom of OCD Mostly controlled on Celexa 40mg  daily - refill today Encourage continue with counselor / therapist

## 2017-06-03 NOTE — Assessment & Plan Note (Signed)
Previous diagnosis Suspect multifactorial, difficult to clearly diagnose today after just meeting patient. Likely mental health emotional strain big part of it, may have reduced sleep - Possibly with labs, due for A1c, TSH, VIt D and Vit B12 as possibilities as well - Follow-up

## 2017-06-03 NOTE — Assessment & Plan Note (Signed)
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

## 2017-06-10 ENCOUNTER — Other Ambulatory Visit: Payer: 59

## 2017-06-10 DIAGNOSIS — F428 Other obsessive-compulsive disorder: Secondary | ICD-10-CM

## 2017-06-10 DIAGNOSIS — R5382 Chronic fatigue, unspecified: Secondary | ICD-10-CM

## 2017-06-10 DIAGNOSIS — E559 Vitamin D deficiency, unspecified: Secondary | ICD-10-CM

## 2017-06-10 DIAGNOSIS — R7309 Other abnormal glucose: Secondary | ICD-10-CM

## 2017-06-10 DIAGNOSIS — E538 Deficiency of other specified B group vitamins: Secondary | ICD-10-CM

## 2017-06-10 DIAGNOSIS — F331 Major depressive disorder, recurrent, moderate: Secondary | ICD-10-CM

## 2017-06-10 DIAGNOSIS — Z114 Encounter for screening for human immunodeficiency virus [HIV]: Secondary | ICD-10-CM

## 2017-06-10 DIAGNOSIS — Z0000 Encounter for general adult medical examination without abnormal findings: Secondary | ICD-10-CM

## 2017-06-11 LAB — LIPID PANEL
CHOL/HDL RATIO: 5 (calc) — AB (ref <5.0)
CHOLESTEROL: 246 mg/dL — AB (ref <200)
HDL: 49 mg/dL — AB (ref >50)
LDL Cholesterol (Calc): 163 mg/dL (calc) — ABNORMAL HIGH
Non-HDL Cholesterol (Calc): 197 mg/dL (calc) — ABNORMAL HIGH (ref <130)
TRIGLYCERIDES: 187 mg/dL — AB (ref <150)

## 2017-06-11 LAB — VITAMIN D 25 HYDROXY (VIT D DEFICIENCY, FRACTURES): VIT D 25 HYDROXY: 20 ng/mL — AB (ref 30–100)

## 2017-06-11 LAB — BASIC METABOLIC PANEL WITH GFR
BUN: 12 mg/dL (ref 7–25)
CO2: 28 mmol/L (ref 20–32)
CREATININE: 0.84 mg/dL (ref 0.50–1.10)
Calcium: 9.3 mg/dL (ref 8.6–10.2)
Chloride: 105 mmol/L (ref 98–110)
GFR, EST AFRICAN AMERICAN: 95 mL/min/{1.73_m2} (ref >=60)
GFR, EST NON AFRICAN AMERICAN: 82 mL/min/{1.73_m2} (ref >=60)
GLUCOSE: 108 mg/dL — AB (ref 65–99)
Potassium: 4.1 mmol/L (ref 3.5–5.3)
SODIUM: 138 mmol/L (ref 135–146)

## 2017-06-11 LAB — CBC
HCT: 38 % (ref 35.0–45.0)
HEMOGLOBIN: 12.9 g/dL (ref 11.7–15.5)
MCH: 28.7 pg (ref 27.0–33.0)
MCHC: 33.9 g/dL (ref 32.0–36.0)
MCV: 84.4 fL (ref 80.0–100.0)
MPV: 10.2 fL (ref 7.5–12.5)
Platelets: 268 10*3/uL (ref 140–400)
RBC: 4.5 10*6/uL (ref 3.80–5.10)
RDW: 13.7 % (ref 11.0–15.0)
WBC: 6.4 10*3/uL (ref 3.8–10.8)

## 2017-06-11 LAB — HEMOGLOBIN A1C
Hgb A1c MFr Bld: 5.6 % of total Hgb (ref <5.7)
Mean Plasma Glucose: 114 (calc)
eAG (mmol/L): 6.3 (calc)

## 2017-06-11 LAB — T4, FREE: FREE T4: 1.2 ng/dL (ref 0.8–1.8)

## 2017-06-11 LAB — VITAMIN B12: VITAMIN B 12: 298 pg/mL (ref 200–1100)

## 2017-06-11 LAB — TSH: TSH: 1.39 mIU/L

## 2017-06-11 LAB — HIV ANTIBODY (ROUTINE TESTING W REFLEX): HIV 1&2 Ab, 4th Generation: NONREACTIVE

## 2017-06-18 DIAGNOSIS — L720 Epidermal cyst: Secondary | ICD-10-CM | POA: Diagnosis not present

## 2017-06-18 DIAGNOSIS — L821 Other seborrheic keratosis: Secondary | ICD-10-CM | POA: Diagnosis not present

## 2017-06-28 ENCOUNTER — Encounter: Payer: Self-pay | Admitting: Family Medicine

## 2017-07-03 ENCOUNTER — Ambulatory Visit: Payer: 59 | Admitting: Family Medicine

## 2017-07-03 ENCOUNTER — Encounter: Payer: Self-pay | Admitting: Family Medicine

## 2017-07-03 VITALS — BP 99/70 | HR 84 | Temp 98.7°F | Resp 16 | Ht 63.0 in | Wt 150.8 lb

## 2017-07-03 DIAGNOSIS — E559 Vitamin D deficiency, unspecified: Secondary | ICD-10-CM

## 2017-07-03 DIAGNOSIS — R7309 Other abnormal glucose: Secondary | ICD-10-CM | POA: Insufficient documentation

## 2017-07-03 DIAGNOSIS — G2581 Restless legs syndrome: Secondary | ICD-10-CM

## 2017-07-03 DIAGNOSIS — R5382 Chronic fatigue, unspecified: Secondary | ICD-10-CM | POA: Diagnosis not present

## 2017-07-03 DIAGNOSIS — E782 Mixed hyperlipidemia: Secondary | ICD-10-CM

## 2017-07-03 DIAGNOSIS — E785 Hyperlipidemia, unspecified: Secondary | ICD-10-CM | POA: Insufficient documentation

## 2017-07-03 MED ORDER — ROPINIROLE HCL 0.25 MG PO TABS: 0.2500 mg | ORAL_TABLET | Freq: Every day | ORAL | 1 refills | Status: DC

## 2017-07-03 NOTE — Patient Instructions (Addendum)
Thank you for coming to the office today.  Start Requip 0.25mg  at bedtime (1-3 BEFORE BEDTIME) when ready - may gradually increase by 1 extra pill up to max of 4 pills at night - may increase weekly if prefer. - Future can change dose or med if need  - Increase hydration for muscle and nerve health, reduce or limit green tea - Diet changes as discussed, heart healthy low cholesterol diet and also emphasis on lower carb / reduce sugar  - Continue Vitamin D - ask pharmacist about liquid  - For cholesterol, no Statin or Fish Oil at the moment  May continue Melatonin and Acetaminophen if you prefer  Sleep Hygiene Recommendations to promote healthy sleep in all patients, especially if symptoms of insomnia are worsening. Due to the nature of sleep rhythms, if your body gets "out of rhythm", it may take some time before your sleep cycle can be "reset".  Please try to follow as many of the following tips as you can, usually there are only a few of these are the primary cause of the problem.  ?To reset your sleep rhythm, go to bed and get up at the same time every day ?Sleep only long enough to feel rested and then get out of bed ?Do not try to force yourself to sleep. If you can't sleep, get out of bed and try again later. ?Avoid naps during the day, unless excessively tired. The more sleeping during the day, then the less sleep your body needs at night.  ?Have coffee, tea, and other foods that have caffeine only in the morning ?Exercise several days a week, but not right before bed ?If you drink alcohol, prefer to have appropriate drink with one meal, but prefer to avoid alcohol in the evening, and bedtime ?If you smoke, avoid smoking, especially in the evening  ?Avoid watching TV or looking at phones, computers, or reading devices ("e-books") that give off light at least 30 minutes before bed. This artificial light sends "awake signals" to your brain and can make it harder to fall  asleep. ?Make your bedroom a comfortable place where it is easy to fall asleep: ? Put up shades or special blackout curtains to block light from outside. ? Use a white noise machine to block noise. ? Keep the temperature cool. ?Try your best to solve or at least address your problems before you go to bed ?Use relaxation techniques to manage stress. Ask your health care provider to suggest some techniques that may work well for you. These may include: ? Breathing exercises. ? Routines to release muscle tension. ? Visualizing peaceful scenes.   Please schedule a Follow-up Appointment to: Return in about 6 months (around 01/02/2018) for Elevated A1c, RLS, Fatigue, Mood/OCD.  If you have any other questions or concerns, please feel free to call the office or send a message through [place]. You may also schedule an earlier appointment if necessary.  Additionally, you may be receiving a survey about your experience at our office within a few days to 1 week by e-mail or mail. We value your feedback.  [name], DO [place]

## 2017-07-03 NOTE — Progress Notes (Signed)
Subjective:    Patient ID: Gloria Harrison, female    DOB: April 26, 1965, 52 y.o.   MRN: [id number]  Gloria Harrison is a 52 y.o. female presenting on 07/03/2017 for RLS (onset month)   HPI   Restless Leg Syndrome - Reports symptoms started about 3 weeks ago, she took a dose a benadryl due to cat allergy and she noticed worse restless leg symptoms, difficulty sleeping at night due to "legs not feeling comfortable", feels she has to "move" legs to get comfortable - Tried Acetaminophen at night with some relief - No prior history of RLS syndrome or prior diagnosis - Tried OTC Melatonin, effective to help her sleep but still legs not cooperating - Admits muscle cramp recently Denies leg pain, redness, swelling, numbness weakness tingling  Vitamin D Deficiency / Fatigue Last visit history of fatigue and tired, she had lab showed Vitamin D 20, has started Vitamin D3 OTC 5,000 iu daily and tolerating well and seems to be improved with fatigue. Now admits to more energy feels better.  HYPERLIPIDEMIA: - Reports concerns with some abnormal results. Last lipid panel 05/2017, with elevated LDL >160 and mild elevated TG >180 - Not on any statin therapy - Family history of high cholesterol, both parents, and she personally had history since age 71s  Elevated A1c Last lab A1c 5.6 Reports no concerns, admits should improve diet CBGs: not checking cbg Meds: never on meds Currently not on ACEi / ARB Lifestyle: - Diet (Not drinking much water, mostly green tea, admits eating higher carb, "comfort foods")  - Exercise (limited regular exercise) Denies hypoglycemia, polyuria, visual changes, numbness or tingling.   Depression screen [facility] 2/9 06/02/2017 11/10/2016 11/06/2016  Decreased Interest 0 0 1  Down, Depressed, Hopeless 0 0 2  PHQ - 2 Score 0 0 3  Altered sleeping 1 0 1  Tired, decreased energy 1 0 3  Change in appetite 0 0 1  Feeling bad or failure about yourself  1 0 1  Trouble concentrating 0 0  1  Moving slowly or fidgety/restless 0 0 1  Suicidal thoughts 0 0 0  PHQ-9 Score 3 0 11  Difficult doing work/chores Not difficult at all - -    Social History   Tobacco Use  . Smoking status: Never Smoker  . Smokeless tobacco: Never Used  Substance Use Topics  . Alcohol use: No    Alcohol/week: 0.0 oz  . Drug use: No    Review of Systems Per HPI unless specifically indicated above     Objective:    BP 99/70   Pulse 84   Temp 98.7 F (37.1 C) (Oral)   Resp 16   Ht 5\' 3"  (1.6 m)   Wt 150 lb 12.8 oz (68.4 kg)   BMI 26.71 kg/m   Wt Readings from Last 3 Encounters:  07/03/17 150 lb 12.8 oz (68.4 kg)  06/02/17 149 lb (67.6 kg)  02/23/17 151 lb 3.2 oz (68.6 kg)    Physical Exam  Constitutional: She is oriented to person, place, and time. She appears well-developed and well-nourished. No distress.  Well-appearing, comfortable, cooperative  HENT:  Head: Normocephalic and atraumatic.  Mouth/Throat: Oropharynx is clear and moist.  Eyes: Conjunctivae are normal. Right eye exhibits no discharge. Left eye exhibits no discharge.  Cardiovascular: Normal rate.  Pulmonary/Chest: Effort normal.  Musculoskeletal: Normal range of motion. She exhibits no edema, tenderness or deformity.  Neurological: She is alert and oriented to person, place, and time.  Skin:  Skin is warm and dry. No rash noted. She is not diaphoretic. No erythema.  Psychiatric: She has a normal mood and affect. Her behavior is normal.  Well groomed, good eye contact, normal speech and thoughts  Nursing note and vitals reviewed.  Results for orders placed or performed in visit on 06/10/17  CBC  Result Value Ref Range   WBC 6.4 3.8 - 10.8 Thousand/uL   RBC 4.50 3.80 - 5.10 Million/uL   Hemoglobin 12.9 11.7 - 15.5 g/dL   HCT 38.0 35.0 - 45.0 %   MCV 84.4 80.0 - 100.0 fL   MCH 28.7 27.0 - 33.0 pg   MCHC 33.9 32.0 - 36.0 g/dL   RDW 13.7 11.0 - 15.0 %   Platelets 268 140 - 400 Thousand/uL   MPV 10.2 7.5 -  12.5 fL  BASIC METABOLIC PANEL WITH GFR  Result Value Ref Range   Glucose, Bld 108 (H) 65 - 99 mg/dL   BUN 12 7 - 25 mg/dL   Creat 0.84 0.50 - 1.10 mg/dL   GFR, Est Non African American 82 > OR = 60 mL/min/1.84m2   GFR, Est African American 95 > OR = 60 mL/min/1.33m2   BUN/Creatinine Ratio NOT APPLICABLE 6 - 22 (calc)   Sodium 138 135 - 146 mmol/L   Potassium 4.1 3.5 - 5.3 mmol/L   Chloride 105 98 - 110 mmol/L   CO2 28 20 - 32 mmol/L   Calcium 9.3 8.6 - 10.2 mg/dL  Vitamin B12  Result Value Ref Range   Vitamin B-12 298 200 - 1,100 pg/mL  VITAMIN D 25 Hydroxy (Vit-D Deficiency, Fractures)  Result Value Ref Range   Vit D, 25-Hydroxy 20 (L) 30 - 100 ng/mL  T4, free  Result Value Ref Range   Free T4 1.2 0.8 - 1.8 ng/dL  TSH  Result Value Ref Range   TSH 1.39 mIU/L  HIV antibody  Result Value Ref Range   HIV 1&2 Ab, 4th Generation NON-REACTIVE NON-REACTI  Lipid panel  Result Value Ref Range   Cholesterol 246 (H) <200 mg/dL   HDL 49 (L) >50 mg/dL   Triglycerides 187 (H) <150 mg/dL   LDL Cholesterol (Calc) 163 (H) mg/dL (calc)   Total CHOL/HDL Ratio 5.0 (H) <5.0 (calc)   Non-HDL Cholesterol (Calc) 197 (H) <130 mg/dL (calc)  Hemoglobin A1c  Result Value Ref Range   Hgb A1c MFr Bld 5.6 <5.7 % of total Hgb   Mean Plasma Glucose 114 (calc)   eAG (mmol/L) 6.3 (calc)      Assessment & Plan:   Problem List Items Addressed This Visit    Chronic fatigue    Notable improvement on Vitamin D replacement Otherwise labs unremarkable Follow-up      Elevated hemoglobin A1c    Mild elevated A1c 5.6, no prior abnormal, possible future PreDM risk   Plan:  1. Not on any therapy currently  2. Encourage improved lifestyle - low carb, low sugar diet, reduce portion size, continue improving regular exercise - handout given 3. Follow-up 6 months A1c POC       Hyperlipidemia    Uncontrolled cholesterol not on statin, poor lifestyle Possibly familial hypercholesterolemia Last lipid  panel 06/2017 Calculated ASCVD 10 yr risk score 1.5% = low  Plan: 1. Discussed ASCVD risk - defer statin at this time, but concern with elevated LDL and fam history 2. Encourage improved lifestyle - low carb/cholesterol, reduce portion size, continue improving regular exercise 3. Follow-up yearly lipid - review history in  6 months      Restless leg syndrome - Primary    Suspect likely etiology given history Recently worse after benadryl dosing, question if chronic problem or recent development Possibly related to history of anxiety / poor sleep and OCD  Plan Discussion treatment options - agree trial Ropinerole 0.25mg  nightly 1-3 hr before bed, dosing instruction given per AVS, may titrate up gradual to 1mg  in future adjust further if need, defer gabapentin instead for now - Improve hydration, stay active avoid overactivity, sleep hygiene given      Relevant Medications   rOPINIRole (REQUIP) 0.25 MG tablet   Vitamin D deficiency    Low Vitamin D 20 on recent lab Suspected contributing to symptoms of fatigue, seems to be improved w/ starting supplement, continue OTC Vitamin D3 5,000 iu daily for 12 weeks then reduce to OTC Vitamin D3 2,000 iu daily for maintenance          Meds ordered this encounter  Medications  . rOPINIRole (REQUIP) 0.25 MG tablet    Sig: Take 1 tablet (0.25 mg total) by mouth at bedtime. May gradually increase dose by 1 extra pill every 1 week if need, max 4 pills = 1mg     Dispense:  60 tablet    Refill:  1    Follow up plan: Return in about 6 months (around 01/02/2018) for Elevated A1c, RLS, Fatigue, Mood/OCD.  [name], [facility] 07/04/2017, 12:13 AM

## 2017-07-04 ENCOUNTER — Encounter: Payer: Self-pay | Admitting: Family Medicine

## 2017-07-04 DIAGNOSIS — G2581 Restless legs syndrome: Secondary | ICD-10-CM | POA: Insufficient documentation

## 2017-07-04 NOTE — Assessment & Plan Note (Signed)
Suspect likely etiology given history Recently worse after benadryl dosing, question if chronic problem or recent development Possibly related to history of anxiety / poor sleep and OCD  Plan Discussion treatment options - agree trial Ropinerole 0.25mg  nightly 1-3 hr before bed, dosing instruction given per AVS, may titrate up gradual to 1mg  in future adjust further if need, defer gabapentin instead for now - Improve hydration, stay active avoid overactivity, sleep hygiene given

## 2017-07-04 NOTE — Assessment & Plan Note (Signed)
Low Vitamin D 20 on recent lab Suspected contributing to symptoms of fatigue, seems to be improved w/ starting supplement, continue OTC Vitamin D3 5,000 iu daily for 12 weeks then reduce to OTC Vitamin D3 2,000 iu daily for maintenance

## 2017-07-04 NOTE — Assessment & Plan Note (Signed)
Mild elevated A1c 5.6, no prior abnormal, possible future PreDM risk   Plan:  1. Not on any therapy currently  2. Encourage improved lifestyle - low carb, low sugar diet, reduce portion size, continue improving regular exercise - handout given 3. Follow-up 6 months A1c POC

## 2017-07-04 NOTE — Assessment & Plan Note (Signed)
Notable improvement on Vitamin D replacement Otherwise labs unremarkable Follow-up

## 2017-07-04 NOTE — Assessment & Plan Note (Signed)
Uncontrolled cholesterol not on statin, poor lifestyle Possibly familial hypercholesterolemia Last lipid panel 06/2017 Calculated ASCVD 10 yr risk score 1.5% = low  Plan: 1. Discussed ASCVD risk - defer statin at this time, but concern with elevated LDL and fam history 2. Encourage improved lifestyle - low carb/cholesterol, reduce portion size, continue improving regular exercise 3. Follow-up yearly lipid - review history in 6 months

## 2017-09-01 ENCOUNTER — Telehealth: Payer: Self-pay | Admitting: Family Medicine

## 2017-09-01 NOTE — Telephone Encounter (Signed)
Pt.alled wanted to know if she could up her heart burn medication. Pt. Call back (work) [phone]

## 2017-09-01 NOTE — Telephone Encounter (Signed)
She is next due for office visit in 12/2017.  We have briefly discussed her stomach acid in 05/2017. She is taking Pantoprazole 40mg  daily.  This is not easy to do over the phone. I can give her basic advice, and then if she needs new rx adjusted we may need to have an office visit or she can provide [place] more specific clinical information on her symptoms.  I attempted to call her on mobile, did not reach her. Did not leave voicemail at this time.  ---------------------------------------------------------------  Could you attempt to call her back and find out the following? - What are her symptoms? - If abdominal pain - does it come and go or is it constant? - Is it all the time or only certain foods trigger? Does stress or anxiety trigger? - Any history of dark stool or blood in stool? Nausea, vomiting?  Her dose of Pantoprazole 40mg  daily is the highest dose of this medicine. The only adjustment would be to consider taking a pill twice a day before meals. However, we only recommend this for short periods of time.  One option if it is uncontrolled heartburn, she may try taking the same rx Pantoprazole 40mg  daily in morning before 1st meal, and then she may take a Zantac 150mg  nightly before dinner. She can try this for 2-4 weeks to see if it helps. Then reduce or stop the Zantac.  If still not helping and just has same heartburn symptoms, she can notify us and then I would consider trial of TWICE a day treatment and we can adjust her rx temporarily.  If severe or worsening symptoms, needs to be seen sooner.  [name], DO [facility] 09/01/2017, 4:59 PM

## 2017-09-02 NOTE — Telephone Encounter (Signed)
Left message for patient to call back  

## 2017-09-03 NOTE — Telephone Encounter (Signed)
As per [facility] her Sxs are of heart burn and chest burn most of the time and more frequent, medication is not improving her Sxs, gets acid reflux burning during days most of the time but also woke her up at night and ended up taking Tums or peptoids, not any specific food triggers the reflux and she does have lots of anxiety and stress at work. She does have some nausea but not dark stool or vomiting.

## 2017-09-03 NOTE — Telephone Encounter (Signed)
Attempted to call patient, LVM did not reach her - detailed and also sent mychart message below:  - Your dose of Pantoprazole 40mg  daily is the highest dose of this medicine. The only adjustment would be to consider taking a pill twice a day before meals. However, we only recommend this for short periods of time.  First - I would actually start by adding Ranitidine (Zantac) 150mg  in evening before dinner. You may continue taking the same rx Pantoprazole 40mg  daily in morning before 1st meal. You may continue this DUAL therapy for 2 to 4 weeks to see if helps, and if needed can continue long term or stop the additional Zantac in future.  If still not helping - then I try DOUBLE dose of Pantoprazole 40mg  take one in morning before breakfast and one in evening before dinner - try for 2 weeks - or may come back to office to discuss this and we can adjust this medicine here. Likely if you are at that point and it is not improving then we would need to consider a GI specialist referral.  I do think that anxiety and stress can make this worse unfortunately.  [name], [facility] 09/03/2017, 12:10 PM

## 2017-11-09 ENCOUNTER — Other Ambulatory Visit: Payer: Self-pay | Admitting: Family Medicine

## 2017-11-09 DIAGNOSIS — G2581 Restless legs syndrome: Secondary | ICD-10-CM

## 2017-11-09 NOTE — Telephone Encounter (Signed)
Pt needs a refill on ropinirole sent to [facility].

## 2017-11-10 MED ORDER — ROPINIROLE HCL 0.25 MG PO TABS: 0.2500 mg | ORAL_TABLET | Freq: Every day | ORAL | 1 refills | Status: DC

## 2017-11-15 DIAGNOSIS — J019 Acute sinusitis, unspecified: Secondary | ICD-10-CM | POA: Diagnosis not present

## 2017-12-08 ENCOUNTER — Other Ambulatory Visit: Payer: Self-pay | Admitting: Family Medicine

## 2017-12-08 ENCOUNTER — Encounter: Payer: Self-pay | Admitting: Family Medicine

## 2017-12-08 ENCOUNTER — Ambulatory Visit (INDEPENDENT_AMBULATORY_CARE_PROVIDER_SITE_OTHER): Payer: 59 | Admitting: Family Medicine

## 2017-12-08 VITALS — BP 114/72 | HR 83 | Temp 98.2°F | Resp 16 | Ht 63.0 in | Wt 157.0 lb

## 2017-12-08 DIAGNOSIS — E782 Mixed hyperlipidemia: Secondary | ICD-10-CM

## 2017-12-08 DIAGNOSIS — F428 Other obsessive-compulsive disorder: Secondary | ICD-10-CM | POA: Diagnosis not present

## 2017-12-08 DIAGNOSIS — Z0000 Encounter for general adult medical examination without abnormal findings: Secondary | ICD-10-CM

## 2017-12-08 DIAGNOSIS — Z23 Encounter for immunization: Secondary | ICD-10-CM

## 2017-12-08 DIAGNOSIS — G2581 Restless legs syndrome: Secondary | ICD-10-CM | POA: Diagnosis not present

## 2017-12-08 DIAGNOSIS — K219 Gastro-esophageal reflux disease without esophagitis: Secondary | ICD-10-CM

## 2017-12-08 DIAGNOSIS — F331 Major depressive disorder, recurrent, moderate: Secondary | ICD-10-CM

## 2017-12-08 DIAGNOSIS — E559 Vitamin D deficiency, unspecified: Secondary | ICD-10-CM

## 2017-12-08 DIAGNOSIS — R7309 Other abnormal glucose: Secondary | ICD-10-CM

## 2017-12-08 DIAGNOSIS — F411 Generalized anxiety disorder: Secondary | ICD-10-CM

## 2017-12-08 MED ORDER — PANTOPRAZOLE SODIUM 40 MG PO TBEC: 40.0000 mg | DELAYED_RELEASE_TABLET | Freq: Every day | ORAL | 1 refills | Status: DC

## 2017-12-08 MED ORDER — CITALOPRAM HYDROBROMIDE 40 MG PO TABS: 40.0000 mg | ORAL_TABLET | Freq: Every day | ORAL | 1 refills | Status: DC

## 2017-12-08 NOTE — Assessment & Plan Note (Signed)
Stable, controlled Refill Protonix 40mg  daily

## 2017-12-08 NOTE — Assessment & Plan Note (Signed)
Improved control RLS now on ropinirole low dose  Continue current Ropinirole 0.25mg  nightly - refill when ready May take Acetaminophen PRN if breakthrough symptoms - seems only needs 1 x month or less Follow-up

## 2017-12-08 NOTE — Patient Instructions (Addendum)
Thank you for coming to the office today.  Flu Shot today  Medicines refilled for 90 day with refills  Ropinirole 0.71m nightly is updated on your med list - but not sent over for refill - SEND ME A MESSAGE OR CALL [place] request the refill when ready.  Check with [facility] for next Mammogram - last done 11/2014 - try to get this updated, let me know if you need.  Colon Cancer Screening: - For all adults age 50+routine colon cancer screening is highly recommended.     - Recent guidelines from [place] starting age of 472- Early detection of colon cancer is important, because often there are no warning signs or symptoms, also if found early usually it can be cured. Late stage is hard to treat.  - If you are not interested in Colonoscopy screening (if done and normal you could be cleared for 5 to 10 years until next due), then Cologuard is an excellent alternative for screening test for Colon Cancer. It is highly sensitive for detecting DNA of colon cancer from even the earliest stages. Also, there is NO bowel prep required. - If Cologuard is NEGATIVE, then it is good for 3 years before next due - If Cologuard is POSITIVE, then it is strongly advised to get a Colonoscopy, which allows the GI doctor to locate the source of the cancer or polyp (even very early stage) and treat it by removing it. ------------------------- If you would like to proceed with Cologuard (stool DNA test) - FIRST, call your insurance company and tell them you want to check cost of Cologuard tell them CPT Code [phone](it may be completely covered and you could get for no cost, OR max cost without any coverage is about $600). Also, keep in mind if you do NOT open the kit, and decide not to do the test, you will NOT be charged, you should contact the company if you decide not to do the test. - If you want to proceed, you can notify [place](phone message, [place] or at next visit) and we will  order it for you. The test kit will be delivered to you house within about 1 week. Follow instructions to collect sample, you may call the company for any help or questions, 24/7 telephone support at [phone]  DUE for FASTING BLOOD WORK (no food or drink after midnight before the lab appointment, only water or coffee without cream/sugar on the morning of)  SCHEDULE "Lab Only" visit in the morning at the clinic for lab draw in 6 MONTHS   - Make sure Lab Only appointment is at about 1 week before your next appointment, so that results will be available  For Lab Results, once available within 2-3 days of blood draw, you can can log in to MyChart online to view your results and a brief explanation. Also, we can discuss results at next follow-up visit.  Please schedule a Follow-up Appointment to: Return in about 6 months (around 06/08/2018) for Annual Physical.  If you have any other questions or concerns, please feel free to call the office or send a message through [place] You may also schedule an earlier appointment if necessary.  Additionally, you may be receiving a survey about your experience at our office within a few days to 1 week by e-mail or mail. We value your feedback.  ANobie Putnam DO [place]

## 2017-12-08 NOTE — Assessment & Plan Note (Addendum)
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

## 2017-12-08 NOTE — Progress Notes (Signed)
Subjective:    Patient ID: Gloria Harrison, female    DOB: [birth year], 52 y.o.   MRN: [id number]  Gloria Harrison is a 52 y.o. female presenting on 12/08/2017 for Hyperlipidemia (follow up) and Restless Leg Syndrome   HPI   Restless Leg Syndrome - Last visit with me 07/03/17, for initial visit for same problem RLS, treated with new start Ropinirol 0.25mg , see prior notes for background information. - Interval update with improvement overall, still has occasional episode x 1 a month with ropinirole not as effective, still has restless leg and will not fall asleep, usually requires a Tylenol as well to help and then it is improved - Today patient reports no new concerns, states she continues Ropinirole 0.25mg  nightly, not due for new rx at this time Denies leg pain, redness, swelling, numbness weakness tingling  GERD Chronic problem, has taken PPI Protonix 40mg  regularly with good results. Due for additional refills at pharmacy. Tolerating med well, no changes or concerns. 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 - Today patient reports no new concerns. Continues current med, needs refills on file Admits no new life stressors currently Failed Wellbutrin in past from 2018, not effective  Health Maintenance: Due for Flu Shot, will receive today    Depression screen [facility] 2/9 12/08/2017 06/02/2017 11/10/2016  Decreased Interest 0 0 0  Down, Depressed, Hopeless 0 0 0  PHQ - 2 Score 0 0 0  Altered sleeping 0 1 0  Tired, decreased energy 0 1 0  Change in appetite 0 0 0  Feeling bad or failure about yourself  0 1 0  Trouble concentrating 0 0 0  Moving slowly or fidgety/restless 0 0 0  Suicidal thoughts 0 0 0  PHQ-9 Score 0 3 0  Difficult doing work/chores Not  difficult at all Not difficult at all -    Social History   Tobacco Use  . Smoking status: Never Smoker  . Smokeless tobacco: Never Used  Substance Use Topics  . Alcohol use: No    Alcohol/week: 0.0 standard drinks  . Drug use: No    Review of Systems Per HPI unless specifically indicated above     Objective:    BP 114/72   Pulse 83   Temp 98.2 F (36.8 C) (Oral)   Resp 16   Ht 5\' 3"  (1.6 m)   Wt 157 lb (71.2 kg)   BMI 27.81 kg/m   Wt Readings from Last 3 Encounters:  12/08/17 157 lb (71.2 kg)  07/03/17 150 lb 12.8 oz (68.4 kg)  06/02/17 149 lb (67.6 kg)    Physical Exam  Constitutional: She is oriented to person, place, and time. She appears well-developed and well-nourished. No distress.  Well-appearing, comfortable, cooperative  HENT:  Head: Normocephalic and atraumatic.  Mouth/Throat: Oropharynx is clear and moist.  Eyes: Conjunctivae are normal. Right eye exhibits no discharge. Left eye exhibits no discharge.  Cardiovascular: Normal rate.  Pulmonary/Chest: Effort normal.  Musculoskeletal: She exhibits no edema.  Neurological: She is alert and oriented to person, place, and time.  Skin: Skin is warm and dry. No rash noted. She is not diaphoretic. No erythema.  Psychiatric: She has a normal mood and affect. Her behavior is normal.  Well groomed, good eye contact, normal speech and thoughts  Nursing note  and vitals reviewed.  Results for orders placed or performed in visit on 06/10/17  CBC  Result Value Ref Range   WBC 6.4 3.8 - 10.8 Thousand/uL   RBC 4.50 3.80 - 5.10 Million/uL   Hemoglobin 12.9 11.7 - 15.5 g/dL   HCT 38.0 35.0 - 45.0 %   MCV 84.4 80.0 - 100.0 fL   MCH 28.7 27.0 - 33.0 pg   MCHC 33.9 32.0 - 36.0 g/dL   RDW 13.7 11.0 - 15.0 %   Platelets 268 140 - 400 Thousand/uL   MPV 10.2 7.5 - 12.5 fL  BASIC METABOLIC PANEL WITH GFR  Result Value Ref Range   Glucose, Bld 108 (H) 65 - 99 mg/dL   BUN 12 7 - 25 mg/dL   Creat 0.84 0.50 - 1.10 mg/dL     GFR, Est Non African American 82 > OR = 60 mL/min/1.71m2   GFR, Est African American 95 > OR = 60 mL/min/1.66m2   BUN/Creatinine Ratio NOT APPLICABLE 6 - 22 (calc)   Sodium 138 135 - 146 mmol/L   Potassium 4.1 3.5 - 5.3 mmol/L   Chloride 105 98 - 110 mmol/L   CO2 28 20 - 32 mmol/L   Calcium 9.3 8.6 - 10.2 mg/dL  Vitamin B12  Result Value Ref Range   Vitamin B-12 298 200 - 1,100 pg/mL  VITAMIN D 25 Hydroxy (Vit-D Deficiency, Fractures)  Result Value Ref Range   Vit D, 25-Hydroxy 20 (L) 30 - 100 ng/mL  T4, free  Result Value Ref Range   Free T4 1.2 0.8 - 1.8 ng/dL  TSH  Result Value Ref Range   TSH 1.39 mIU/L  HIV antibody  Result Value Ref Range   HIV 1&2 Ab, 4th Generation NON-REACTIVE NON-REACTI  Lipid panel  Result Value Ref Range   Cholesterol 246 (H) <200 mg/dL   HDL 49 (L) >50 mg/dL   Triglycerides 187 (H) <150 mg/dL   LDL Cholesterol (Calc) 163 (H) mg/dL (calc)   Total CHOL/HDL Ratio 5.0 (H) <5.0 (calc)   Non-HDL Cholesterol (Calc) 197 (H) <130 mg/dL (calc)  Hemoglobin A1c  Result Value Ref Range   Hgb A1c MFr Bld 5.6 <5.7 % of total Hgb   Mean Plasma Glucose 114 (calc)   eAG (mmol/L) 6.3 (calc)      Assessment & Plan:   Problem List Items Addressed This Visit    GERD (gastroesophageal reflux disease)    Stable, controlled Refill Protonix 40mg  daily      Relevant Medications   pantoprazole (PROTONIX) 40 MG tablet   OCD (obsessive compulsive disorder)    Stable currently, chronic history of OCD with mixed depression/anxiety  Controlled on SSRI Celexa 40mg  daily, refilled No longer followed by Psychiatry Followed by counselor/therapist      Relevant Medications   citalopram (CELEXA) 40 MG tablet   Restless leg syndrome - Primary    Improved control RLS now on ropinirole low dose  Continue current Ropinirole 0.25mg  nightly - refill when ready May take Acetaminophen PRN if breakthrough symptoms - seems only needs 1 x month or less Follow-up       Relevant Medications   rOPINIRole (REQUIP) 0.25 MG tablet    Other Visit Diagnoses    Needs flu shot       Relevant Orders   Flu Vaccine QUAD 36+ mos IM (Completed)      Meds ordered this encounter  Medications  . pantoprazole (PROTONIX) 40 MG tablet    Sig: Take 1 tablet (  40 mg total) by mouth daily before breakfast.    Dispense:  90 tablet    Refill:  1    Keep refills on file  . citalopram (CELEXA) 40 MG tablet    Sig: Take 1 tablet (40 mg total) by mouth daily.    Dispense:  90 tablet    Refill:  1    Keep refills on file for future    Follow up plan: Return in about 6 months (around 06/08/2018) for Annual Physical.  Future labs ordered for 06/04/18  [name], [facility] 12/08/2017, 2:50 PM

## 2018-02-05 ENCOUNTER — Telehealth: Payer: Self-pay | Admitting: Family Medicine

## 2018-02-05 DIAGNOSIS — K219 Gastro-esophageal reflux disease without esophagitis: Secondary | ICD-10-CM

## 2018-02-05 DIAGNOSIS — Z1211 Encounter for screening for malignant neoplasm of colon: Secondary | ICD-10-CM

## 2018-02-05 NOTE — Telephone Encounter (Addendum)
Pt needs referral to [facility] Dr. Bonna Gains for colonoscopy and endoscopy.  Her call back number is [phone]

## 2018-02-05 NOTE — Telephone Encounter (Signed)
Does she need to come in for OV for these referrals?

## 2018-02-05 NOTE — Telephone Encounter (Signed)
Attempted to call patient. I left a voicemail.  We discussed these referrals at previous appointment.  I agree to go ahead and place these referrals now, she does not need a new office visit with me at this time.  Referral to [facility] Dr Bonna Gains for Colonoscopy and likely EGD. They will schedule w/ patient.  No further action required. Thanks.  [name], [facility] 02/05/2018, 3:18 PM

## 2018-03-02 ENCOUNTER — Encounter: Payer: Self-pay | Admitting: Gastroenterology

## 2018-03-02 ENCOUNTER — Ambulatory Visit: Payer: 59 | Admitting: Gastroenterology

## 2018-03-02 VITALS — BP 124/76 | HR 97 | Ht 63.0 in | Wt 162.8 lb

## 2018-03-02 DIAGNOSIS — K219 Gastro-esophageal reflux disease without esophagitis: Secondary | ICD-10-CM | POA: Diagnosis not present

## 2018-03-02 NOTE — Progress Notes (Signed)
Gloria Harrison [address]  [place]  [place], [place] [address]  Main: [phone]  Fax: [phone]   Gastroenterology Consultation  Referring Provider:     [name] * Primary Care Physician:  Olin Hauser, DO Reason for Consultation:     GERD, screening colonoscopy        HPI:   Chief complaint: Heartburn  Gloria Harrison is a 53 y.o. y/o female referred for consultation & management  by Dr. Parks Ranger, Devonne Doughty, DO.  Patient is seen by primary care provider and was placed on Protonix for GERD.  Patient reports symptoms were 1 to 2 years.  Has been on Protonix once daily for 1 to 2 years.  Takes it in the morning, and tries to take it 30 minutes before food, but sometimes is awaiting for hours before eating food.  Reports breakthrough symptoms occurring at night, wakes her up and she has to take Tums on certain nights.  Breakthrough symptoms are occurring about once a week.  The patient denies abdominal or flank pain, anorexia, nausea or vomiting, dysphagia, change in bowel habits or black or bloody stools or weight loss.  No family history of colon cancer.  No prior endoscopy.  Past Medical History:  Diagnosis Date  . Chronic kidney disease   . GERD (gastroesophageal reflux disease)   . OCD (obsessive compulsive disorder)     Past Surgical History:  Procedure Laterality Date  . CESAREAN SECTION  1996  . CESAREAN SECTION  2001  . COSMETIC SURGERY  2002   Tummy tuck  . TONSILLECTOMY  1990    Prior to Admission medications   Medication Sig Start Date End Date Taking? Authorizing Provider  citalopram (CELEXA) 40 MG tablet Take 1 tablet (40 mg total) by mouth daily. 12/08/17   Karamalegos, Devonne Doughty, DO  pantoprazole (PROTONIX) 40 MG tablet Take 1 tablet (40 mg total) by mouth daily before breakfast. 12/08/17   Karamalegos, Devonne Doughty, DO  rOPINIRole (REQUIP) 0.25 MG tablet Take 1 tablet (0.25 mg total) by mouth at bedtime.  12/08/17   Parks Ranger, Devonne Doughty, DO  XIIDRA 5 % SOLN  06/01/17   [provider]    Family History  Problem Relation Age of Onset  . Kidney Stones Mother   . Hyperlipidemia Mother   . Diabetes Father   . Hyperlipidemia Father   . Hypertension Father   . Asthma Son   . Suicidality Son   . Depression Son   . Hypothyroidism Sister   . Colon cancer Neg Hx      Social History   Tobacco Use  . Smoking status: Never Smoker  . Smokeless tobacco: Never Used  Substance Use Topics  . Alcohol use: No    Alcohol/week: 0.0 standard drinks  . Drug use: No    Allergies as of 03/02/2018 - Review Complete 12/08/2017  Allergen Reaction Noted  . Ciprofloxacin Hives 08/09/2014    Review of Systems:    All systems reviewed and negative except where noted in HPI.   Physical Exam:   Vitals:   03/02/18 1009  BP: 124/76  Pulse: 97  Weight: 162 lb 12.8 oz (73.8 kg)  Height: 5\' 3"  (1.6 m)    No LMP recorded. Psych:  Alert and cooperative. Normal mood and affect. General:   Alert,  Well-developed, well-nourished, pleasant and cooperative in NAD Head:  Normocephalic and atraumatic. Eyes:  Sclera clear, no icterus.   Conjunctiva pink. Ears:  Normal auditory acuity.  Nose:  No deformity, discharge, or lesions. Mouth:  No deformity or lesions,oropharynx pink & moist. Neck:  Supple; no masses or thyromegaly. Abdomen:  Normal bowel sounds.  No bruits.  Soft, non-tender and non-distended without masses, hepatosplenomegaly or hernias noted.  No guarding or rebound tenderness.    Msk:  Symmetrical without gross deformities. Good, equal movement & strength bilaterally. Pulses:  Normal pulses noted. Extremities:  No clubbing or edema.  No cyanosis. Neurologic:  Alert and oriented x3;  grossly normal neurologically. Skin:  Intact without significant lesions or rashes. No jaundice. Lymph Nodes:  No significant cervical adenopathy. Psych:  Alert and cooperative. Normal mood and  affect.   Labs: CBC    Component Value Date/Time   WBC 6.4 06/10/2017 0811   RBC 4.50 06/10/2017 0811   HGB 12.9 06/10/2017 0811   HGB 13.1 08/09/2014 1653   HCT 38.0 06/10/2017 0811   HCT 39.9 08/09/2014 1653   PLT 268 06/10/2017 0811   PLT 304 08/09/2014 1653   MCV 84.4 06/10/2017 0811   MCV 87 08/09/2014 1653   MCV 88 12/31/2011 0124   MCH 28.7 06/10/2017 0811   MCHC 33.9 06/10/2017 0811   RDW 13.7 06/10/2017 0811   RDW 14.2 08/09/2014 1653   RDW 13.4 12/31/2011 0124   LYMPHSABS 1,338 02/19/2017 1736   LYMPHSABS 1.8 08/09/2014 1653   LYMPHSABS 1.2 12/31/2011 0124   MONOABS 0.8 12/31/2011 0124   EOSABS 100 02/19/2017 1736   EOSABS 0.1 08/09/2014 1653   EOSABS 0.1 12/31/2011 0124   BASOSABS 64 02/19/2017 1736   BASOSABS 0.0 08/09/2014 1653   BASOSABS 0.0 12/31/2011 0124   CMP     Component Value Date/Time   NA 138 06/10/2017 0811   NA 141 08/09/2014 1653   NA 141 12/31/2011 0124   K 4.1 06/10/2017 0811   K 3.5 12/31/2011 0124   CL 105 06/10/2017 0811   CL 109 (H) 12/31/2011 0124   CO2 28 06/10/2017 0811   CO2 24 12/31/2011 0124   GLUCOSE 108 (H) 06/10/2017 0811   GLUCOSE 131 (H) 12/31/2011 0124   BUN 12 06/10/2017 0811   BUN 11 08/09/2014 1653   BUN 16 12/31/2011 0124   CREATININE 0.84 06/10/2017 0811   CALCIUM 9.3 06/10/2017 0811   CALCIUM 9.1 12/31/2011 0124   PROT 6.7 02/23/2017 1650   PROT 6.9 08/09/2014 1653   PROT 6.8 12/31/2011 0124   ALBUMIN 4.4 08/09/2014 1653   ALBUMIN 3.8 12/31/2011 0124   AST 9 (L) 02/23/2017 1650   AST 19 12/31/2011 0124   ALT 9 02/23/2017 1650   ALT 20 12/31/2011 0124   ALKPHOS 62 08/09/2014 1653   ALKPHOS 68 12/31/2011 0124   BILITOT 0.2 02/23/2017 1650   BILITOT 0.3 08/09/2014 1653   BILITOT 0.4 12/31/2011 0124   GFRNONAA 82 06/10/2017 0811   GFRAA 95 06/10/2017 0811    Imaging Studies: No results found.  Assessment and Plan:   ROSELIND KLUS is a 53 y.o. y/o female has been referred for GERD, and evaluation  for screening colonoscopy  Patient educated extensively on acid reflux lifestyle modification, including buying a bed wedge, not eating 3 hrs before bedtime, diet modifications, and handout given for the same.   Given her chronic history of GERD, and breakthrough symptoms, patient would like an upper endoscopy for further evaluation.  Upper endoscopy would also allow [place] to evaluate for Barrett's given ongoing GERD for about 2 years and no prior upper endoscopy, need for ongoing  Protonix.  (Risks of PPI use were discussed with patient including bone loss, C. Diff diarrhea, pneumonia, infections, CKD, electrolyte abnormalities.  If clinically possible based on symptoms, goal would be to maintain patient on the lowest dose possible, or discontinue the medication with institution of acid reflux lifestyle modifications over time. Pt. Verbalizes understanding and chooses to continue the medication.)  Patient is due for screening colonoscopy and is agreeable to scheduling  I have discussed alternative options, risks & benefits,  which include, but are not limited to, bleeding, infection, perforation,respiratory complication & drug reaction.  The patient agrees with this plan & written consent will be obtained.      Dr Gloria Harrison  Speech recognition software was used to dictate the above note.

## 2018-03-02 NOTE — Patient Instructions (Signed)
Bed wedge  Gastroesophageal Reflux Disease, Adult Gastroesophageal reflux (GER) happens when acid from the stomach flows up into the tube that connects the mouth and the stomach (esophagus). Normally, food travels down the esophagus and stays in the stomach to be digested. With GER, food and stomach acid sometimes move back up into the esophagus. You may have a disease called gastroesophageal reflux disease (GERD) if the reflux:  Happens often.  Causes frequent or very bad symptoms.  Causes problems such as damage to the esophagus. When this happens, the esophagus becomes sore and swollen (inflamed). Over time, GERD can make small holes (ulcers) in the lining of the esophagus. What are the causes? This condition is caused by a problem with the muscle between the esophagus and the stomach. When this muscle is weak or not normal, it does not close properly to keep food and acid from coming back up from the stomach. The muscle can be weak because of:  Tobacco use.  Pregnancy.  Having a certain type of hernia (hiatal hernia).  Alcohol use.  Certain foods and drinks, such as coffee, chocolate, onions, and peppermint. What increases the risk? You are more likely to develop this condition if you:  Are overweight.  Have a disease that affects your connective tissue.  Use NSAID medicines. What are the signs or symptoms? Symptoms of this condition include:  Heartburn.  Difficult or painful swallowing.  The feeling of having a lump in the throat.  A bitter taste in the mouth.  Bad breath.  Having a lot of saliva.  Having an upset or bloated stomach.  Belching.  Chest pain. Different conditions can cause chest pain. Make sure you see your doctor if you have chest pain.  Shortness of breath or noisy breathing (wheezing).  Ongoing (chronic) cough or a cough at night.  Wearing away of the surface of teeth (tooth enamel).  Weight loss. How is this treated? Treatment will  depend on how bad your symptoms are. Your doctor may suggest:  Changes to your diet.  Medicine.  Surgery. Follow these instructions at home: Eating and drinking   Follow a diet as told by your doctor. You may need to avoid foods and drinks such as: ? Coffee and tea (with or without caffeine). ? Drinks that contain alcohol. ? Energy drinks and sports drinks. ? Bubbly (carbonated) drinks or sodas. ? Chocolate and cocoa. ? Peppermint and mint flavorings. ? Garlic and onions. ? Horseradish. ? Spicy and acidic foods. These include peppers, chili powder, curry powder, vinegar, hot sauces, and BBQ sauce. ? Citrus fruit juices and citrus fruits, such as oranges, lemons, and limes. ? Tomato-based foods. These include red sauce, chili, salsa, and pizza with red sauce. ? Fried and fatty foods. These include donuts, french fries, potato chips, and high-fat dressings. ? High-fat meats. These include hot dogs, rib eye steak, sausage, ham, and bacon. ? High-fat dairy items, such as whole milk, butter, and cream cheese.  Eat small meals often. Avoid eating large meals.  Avoid drinking large amounts of liquid with your meals.  Avoid eating meals during the 2-3 hours before bedtime.  Avoid lying down right after you eat.  Do not exercise right after you eat. Lifestyle   Do not use any products that contain nicotine or tobacco. These include cigarettes, e-cigarettes, and chewing tobacco. If you need help quitting, ask your doctor.  Try to lower your stress. If you need help doing this, ask your doctor.  If you are overweight,  lose an amount of weight that is healthy for you. Ask your doctor about a safe weight loss goal. General instructions  Pay attention to any changes in your symptoms.  Take over-the-counter and prescription medicines only as told by your doctor. Do not take aspirin, ibuprofen, or other NSAIDs unless your doctor says it is okay.  Wear loose clothes. Do not wear  anything tight around your waist.  Raise (elevate) the head of your bed about 6 inches (15 cm).  Avoid bending over if this makes your symptoms worse.  Keep all follow-up visits as told by your doctor. This is important. Contact a doctor if:  You have new symptoms.  You lose weight and you do not know why.  You have trouble swallowing or it hurts to swallow.  You have wheezing or a cough that keeps happening.  Your symptoms do not get better with treatment.  You have a hoarse voice. Get help right away if:  You have pain in your arms, neck, jaw, teeth, or back.  You feel sweaty, dizzy, or light-headed.  You have chest pain or shortness of breath.  You throw up (vomit) and your throw-up looks like blood or coffee grounds.  You pass out (faint).  Your poop (stool) is bloody or black.  You cannot swallow, drink, or eat. Summary  If a person has gastroesophageal reflux disease (GERD), food and stomach acid move back up into the esophagus and cause symptoms or problems such as damage to the esophagus.  Treatment will depend on how bad your symptoms are.  Follow a diet as told by your doctor.  Take all medicines only as told by your doctor. This information is not intended to replace advice given to you by your health care provider. Make sure you discuss any questions you have with your health care provider. Document Released: 07/02/2007 Document Revised: 07/22/2017 Document Reviewed: 07/22/2017 Elsevier Interactive Patient Education  2019 [employer].

## 2018-03-03 ENCOUNTER — Other Ambulatory Visit: Payer: Self-pay | Admitting: Nurse Practitioner

## 2018-03-03 DIAGNOSIS — G2581 Restless legs syndrome: Secondary | ICD-10-CM

## 2018-03-04 ENCOUNTER — Other Ambulatory Visit: Payer: Self-pay

## 2018-03-04 DIAGNOSIS — Z1211 Encounter for screening for malignant neoplasm of colon: Secondary | ICD-10-CM

## 2018-03-04 DIAGNOSIS — K219 Gastro-esophageal reflux disease without esophagitis: Secondary | ICD-10-CM

## 2018-03-08 ENCOUNTER — Other Ambulatory Visit: Payer: Self-pay

## 2018-03-08 DIAGNOSIS — K219 Gastro-esophageal reflux disease without esophagitis: Secondary | ICD-10-CM

## 2018-03-08 DIAGNOSIS — Z1211 Encounter for screening for malignant neoplasm of colon: Secondary | ICD-10-CM

## 2018-03-22 ENCOUNTER — Telehealth: Payer: Self-pay | Admitting: Gastroenterology

## 2018-03-22 NOTE — Telephone Encounter (Signed)
Pt left vm she is scheduled for procedure 03/24/18 and has a question for a nurse she would like a call on her work#

## 2018-03-22 NOTE — Telephone Encounter (Signed)
Pt inquired on taking Tylenol due to her sciatica giving her pain. Pt inquired about taking this prior to surgery. I did inform pt that she is to be NPO after midnight, except for her prep and that needs to be completed 4 hrs before procedure. Pt states she had some biofreeze, could she use that? I informed her not to use this unless she had to and not 4 hrs prior to procedure.

## 2018-03-24 ENCOUNTER — Encounter: Payer: Self-pay | Admitting: *Deleted

## 2018-03-24 ENCOUNTER — Ambulatory Visit: Payer: 59 | Admitting: Certified Registered"

## 2018-03-24 ENCOUNTER — Ambulatory Visit
Admission: RE | Admit: 2018-03-24 | Discharge: 2018-03-24 | Disposition: A | Discharge status: Home / Self Care | Payer: 59 | Attending: Gastroenterology | Admitting: Gastroenterology

## 2018-03-24 ENCOUNTER — Other Ambulatory Visit: Payer: Self-pay

## 2018-03-24 ENCOUNTER — Encounter
Admission: RE | Disposition: A | Discharge status: Home / Self Care | Payer: Self-pay | Source: Home / Self Care | Attending: Gastroenterology

## 2018-03-24 DIAGNOSIS — Z888 Allergy status to other drugs, medicaments and biological substances status: Secondary | ICD-10-CM | POA: Insufficient documentation

## 2018-03-24 DIAGNOSIS — K219 Gastro-esophageal reflux disease without esophagitis: Secondary | ICD-10-CM | POA: Diagnosis not present

## 2018-03-24 DIAGNOSIS — N189 Chronic kidney disease, unspecified: Secondary | ICD-10-CM | POA: Diagnosis not present

## 2018-03-24 DIAGNOSIS — K317 Polyp of stomach and duodenum: Secondary | ICD-10-CM

## 2018-03-24 DIAGNOSIS — D131 Benign neoplasm of stomach: Secondary | ICD-10-CM | POA: Insufficient documentation

## 2018-03-24 DIAGNOSIS — K635 Polyp of colon: Secondary | ICD-10-CM

## 2018-03-24 DIAGNOSIS — F429 Obsessive-compulsive disorder, unspecified: Secondary | ICD-10-CM | POA: Diagnosis not present

## 2018-03-24 DIAGNOSIS — F329 Major depressive disorder, single episode, unspecified: Secondary | ICD-10-CM | POA: Insufficient documentation

## 2018-03-24 DIAGNOSIS — Z1211 Encounter for screening for malignant neoplasm of colon: Secondary | ICD-10-CM

## 2018-03-24 DIAGNOSIS — Z79899 Other long term (current) drug therapy: Secondary | ICD-10-CM | POA: Insufficient documentation

## 2018-03-24 DIAGNOSIS — K2289 Other specified disease of esophagus: Secondary | ICD-10-CM

## 2018-03-24 DIAGNOSIS — K228 Other specified diseases of esophagus: Secondary | ICD-10-CM | POA: Diagnosis not present

## 2018-03-24 DIAGNOSIS — F419 Anxiety disorder, unspecified: Secondary | ICD-10-CM | POA: Diagnosis not present

## 2018-03-24 DIAGNOSIS — D125 Benign neoplasm of sigmoid colon: Secondary | ICD-10-CM | POA: Insufficient documentation

## 2018-03-24 HISTORY — PX: ESOPHAGOGASTRODUODENOSCOPY (EGD) WITH PROPOFOL: SHX5813

## 2018-03-24 HISTORY — PX: COLONOSCOPY WITH PROPOFOL: SHX5780

## 2018-03-24 LAB — POCT PREGNANCY, URINE: Preg Test, Ur: NEGATIVE

## 2018-03-24 SURGERY — COLONOSCOPY WITH PROPOFOL
Anesthesia: General

## 2018-03-24 MED ORDER — LIDOCAINE HCL (CARDIAC) PF 100 MG/5ML IV SOSY
PREFILLED_SYRINGE | INTRAVENOUS | Status: DC | PRN
  Administered 2018-03-24: 100 mg via INTRATRACHEAL

## 2018-03-24 MED ORDER — GLYCOPYRROLATE 0.2 MG/ML IJ SOLN
INTRAMUSCULAR | Status: DC | PRN
  Administered 2018-03-24: 0.2 mg via INTRAVENOUS

## 2018-03-24 MED ORDER — PHENYLEPHRINE HCL 10 MG/ML IJ SOLN
INTRAMUSCULAR | Status: DC | PRN
  Administered 2018-03-24: 100 ug via INTRAVENOUS

## 2018-03-24 MED ORDER — EPHEDRINE SULFATE 50 MG/ML IJ SOLN
INTRAMUSCULAR | Status: DC | PRN
  Administered 2018-03-24: 10 mg via INTRAVENOUS

## 2018-03-24 MED ORDER — PROPOFOL 10 MG/ML IV BOLUS
INTRAVENOUS | Status: DC | PRN
  Administered 2018-03-24: 10 mg via INTRAVENOUS
  Administered 2018-03-24: 50 mg via INTRAVENOUS
  Administered 2018-03-24 (×4): 10 mg via INTRAVENOUS
  Administered 2018-03-24: 50 mg via INTRAVENOUS
  Administered 2018-03-24 (×10): 10 mg via INTRAVENOUS

## 2018-03-24 MED ORDER — SODIUM CHLORIDE 0.9 % IV SOLN
INTRAVENOUS | Status: DC
  Administered 2018-03-24: 09:00:00 via INTRAVENOUS

## 2018-03-24 NOTE — H&P (Signed)
Gloria Antigua, MD [address], [place], [place], [place], [address] [address], [place], [place], [place], [address] Phone: [phone]  Fax: [phone]  Primary Care Physician:  Olin Hauser, DO   Pre-Procedure History & Physical: HPI:  Gloria Harrison is a 53 y.o. female is here for a colonoscopy and EGD.   Past Medical History:  Diagnosis Date  . Chronic kidney disease   . GERD (gastroesophageal reflux disease)   . OCD (obsessive compulsive disorder)     Past Surgical History:  Procedure Laterality Date  . CESAREAN SECTION  1996  . CESAREAN SECTION  2001  . COSMETIC SURGERY  2002   Tummy tuck  . TONSILLECTOMY  1990    Prior to Admission medications   Medication Sig Start Date End Date Taking? Authorizing Provider  citalopram (CELEXA) 40 MG tablet Take 1 tablet (40 mg total) by mouth daily. 12/08/17  Yes Karamalegos, Devonne Doughty, DO  pantoprazole (PROTONIX) 40 MG tablet Take 1 tablet (40 mg total) by mouth daily before breakfast. 12/08/17  Yes Karamalegos, Devonne Doughty, DO  rOPINIRole (REQUIP) 0.25 MG tablet Take 4-8 tablets (1-2 mg total) by mouth at bedtime. 03/03/18  Yes Karamalegos, Devonne Doughty, DO  XIIDRA 5 % SOLN  06/01/17  Yes [provider]    Allergies as of 03/04/2018 - Review Complete 03/02/2018  Allergen Reaction Noted  . Ciprofloxacin Hives 08/09/2014    Family History  Problem Relation Age of Onset  . Kidney Stones Mother   . Hyperlipidemia Mother   . Diabetes Father   . Hyperlipidemia Father   . Hypertension Father   . Asthma Son   . Suicidality Son   . Depression Son   . Hypothyroidism Sister   . Colon cancer Neg Hx     Social History   Socioeconomic History  . Marital status: Married    Spouse name: Not on file  . Number of children: Not on file  . Years of education: Not on file  . Highest education level: Not on file  Occupational History  . Occupation: Special educational needs teacher  Social Needs  .  Financial resource strain: Not on file  . Food insecurity:    Worry: Not on file    Inability: Not on file  . Transportation needs:    Medical: Not on file    Non-medical: Not on file  Tobacco Use  . Smoking status: Never Smoker  . Smokeless tobacco: Never Used  Substance and Sexual Activity  . Alcohol use: No    Alcohol/week: 0.0 standard drinks  . Drug use: No  . Sexual activity: Yes    Partners: Male    Birth control/protection: Other-see comments    Comment: Husband had a Vasectomy.  Lifestyle  . Physical activity:    Days per week: Not on file    Minutes per session: Not on file  . Stress: Not on file  Relationships  . Social connections:    Talks on phone: Not on file    Gets together: Not on file    Attends religious service: Not on file    Active member of club or organization: Not on file    Attends meetings of clubs or organizations: Not on file    Relationship status: Not on file  . Intimate partner violence:    Fear of current or ex partner: Not on file    Emotionally abused: Not on file    Physically abused: Not on file  Forced sexual activity: Not on file  Other Topics Concern  . Not on file  Social History Narrative  . Not on file    Review of Systems: See HPI, otherwise negative ROS  Physical Exam: BP 126/83   Pulse 92   Temp (!) 97.2 F (36.2 C) (Tympanic)   Resp 16   Ht 5\' 2"  (1.575 m)   Wt 72.6 kg   SpO2 99%   BMI 29.26 kg/m  General:   Alert,  pleasant and cooperative in NAD Head:  Normocephalic and atraumatic. Neck:  Supple; no masses or thyromegaly. Lungs:  Clear throughout to auscultation, normal respiratory effort.    Heart:  +S1, +S2, Regular rate and rhythm, No edema. Abdomen:  Soft, nontender and nondistended. Normal bowel sounds, without guarding, and without rebound.   Neurologic:  Alert and  oriented x4;  grossly normal neurologically.  Impression/Plan: Gloria Harrison is here for a colonoscopy to be performed for average  risk screening and EGD for Acid Reflux.  Risks, benefits, limitations, and alternatives regarding the procedures have been reviewed with the patient.  Questions have been answered.  All parties agreeable.   Virgel Manifold, MD  03/24/2018, 8:57 AM

## 2018-03-24 NOTE — Op Note (Addendum)
[facility] Gastroenterology Patient Name: Gloria Harrison Procedure Date: 03/24/2018 9:03 AM MRN: [id number] Account #: [id number] Date of Birth: 01-01-1966 Admit Type: Outpatient Age: 53 Room: [facility] ENDO ROOM 4 Gender: Female Note Status: Finalized Procedure:            Colonoscopy Indications:          Screening for colorectal malignant neoplasm Providers:            Merriam Brandner B. Bonna Gains MD, MD Referring MD:         Olin Hauser (Referring MD) Medicines:            Monitored Anesthesia Care Complications:        No immediate complications. Procedure:            Pre-Anesthesia Assessment:                       - ASA Grade Assessment: II - A patient with mild                        systemic disease.                       - Prior to the procedure, a History and Physical was                        performed, and patient medications, allergies and                        sensitivities were reviewed. The patient's tolerance of                        previous anesthesia was reviewed.                       - The risks and benefits of the procedure and the                        sedation options and risks were discussed with the                        patient. All questions were answered and informed                        consent was obtained.                       - Patient identification and proposed procedure were                        verified prior to the procedure by the physician, the                        nurse, the anesthesiologist, the anesthetist and the                        technician. The procedure was verified in the procedure                        room.  After obtaining informed consent, the colonoscope was                        passed under direct vision. Throughout the procedure,                        the patient's blood pressure, pulse, and oxygen                        saturations were monitored continuously. The                        Colonoscope was introduced through the anus and                        advanced to the the cecum, identified by appendiceal                        orifice and ileocecal valve. The colonoscopy was                        performed with ease. The patient tolerated the                        procedure well. The quality of the bowel preparation                        was good. Findings:      The perianal and digital rectal examinations were normal.      A 5 mm polyp was found in the sigmoid colon. The polyp was sessile. The       polyp was removed with a jumbo cold forceps. Resection and retrieval       were complete.      The exam was otherwise without abnormality.      The rectum, sigmoid colon, descending colon, transverse colon, ascending       colon and cecum appeared normal.      The retroflexed view of the distal rectum and anal verge was normal and       showed no anal or rectal abnormalities. Impression:           - One 5 mm polyp in the sigmoid colon, removed with a                        jumbo cold forceps. Resected and retrieved.                       - The examination was otherwise normal.                       - The rectum, sigmoid colon, descending colon,                        transverse colon, ascending colon and cecum are normal.                       - The distal rectum and anal verge are normal on                        retroflexion view. Recommendation:       -  Discharge patient to home (with escort).                       - Advance diet as tolerated.                       - Continue present medications.                       - Await pathology results.                       - The findings and recommendations were discussed with                        the patient.                       - The findings and recommendations were discussed with                        the patient's family.                       - Return to primary care physician as  previously                        scheduled.                       - If the pathology report reveals adenomatous tissue,                        then repeat the colonoscopy for surveillance in 5 years.                       - If the pathology report indicates hyperplastic polyp,                        then repeat colonoscopy for screening purposes in 10                        years. Procedure Code(s):    --- Professional ---                       [phone], Colonoscopy, flexible; with biopsy, single or                        multiple Diagnosis Code(s):    --- Professional ---                       Z12.11, Encounter for screening for malignant neoplasm                        of colon                       D12.5, Benign neoplasm of sigmoid colon CPT copyright 2018 American Medical Association. All rights reserved. The codes documented in this report are preliminary and upon coder review may  be revised to meet current compliance requirements.  [name], MD Margretta Sidle B. Bonna Gains MD, MD 03/24/2018 10:00:05 AM This report has been signed electronically. Number of Addenda:  0 Note Initiated On: 03/24/2018 9:03 AM Scope Withdrawal Time: 0 hours 16 minutes 38 seconds  Total Procedure Duration: 0 hours 24 minutes 23 seconds  Estimated Blood Loss: Estimated blood loss: none.      [facility]

## 2018-03-24 NOTE — Transfer of Care (Signed)
Immediate Anesthesia Transfer of Care Note  Patient: Gloria Harrison  Procedure(s) Performed: COLONOSCOPY WITH PROPOFOL (N/A ) ESOPHAGOGASTRODUODENOSCOPY (EGD) WITH PROPOFOL (N/A )  Patient Location: Endoscopy Unit  Anesthesia Type:General  Level of Consciousness: drowsy and responds to stimulation  Airway & Oxygen Therapy: Patient Spontanous Breathing and Patient connected to nasal cannula oxygen  Post-op Assessment: Report given to RN and Post -op Vital signs reviewed and stable  Post vital signs: Reviewed and stable  Last Vitals:  Vitals Value Taken Time  BP 86/42 03/24/2018 10:01 AM  Temp    Pulse 102 03/24/2018 10:01 AM  Resp 13 03/24/2018 10:01 AM  SpO2 98 % 03/24/2018 10:01 AM    Last Pain:  Vitals:   03/24/18 0849  TempSrc: Tympanic  PainSc: 0-No pain         Complications: No apparent anesthesia complications

## 2018-03-24 NOTE — Anesthesia Preprocedure Evaluation (Addendum)
Anesthesia Evaluation  Patient identified by MRN, date of birth, ID band Patient awake    Reviewed: Allergy & Precautions, H&P , NPO status , Patient's Chart, lab work & pertinent test results  Airway Mallampati: III  TM Distance: >3 FB     Dental  (+) Teeth Intact   Pulmonary neg pulmonary ROS,           Cardiovascular negative cardio ROS       Neuro/Psych PSYCHIATRIC DISORDERS Anxiety Depression negative neurological ROS     GI/Hepatic Neg liver ROS, GERD  Medicated,  Endo/Other  negative endocrine ROS  Renal/GU CRFRenal disease  negative genitourinary   Musculoskeletal   Abdominal   Peds  Hematology negative hematology ROS (+)   Anesthesia Other Findings Past Medical History: No date: Chronic kidney disease No date: GERD (gastroesophageal reflux disease) No date: OCD (obsessive compulsive disorder)  Past Surgical History: 1996: CESAREAN SECTION 2001: CESAREAN SECTION 2002: COSMETIC SURGERY     Comment:  Tummy tuck 1990: TONSILLECTOMY     Reproductive/Obstetrics negative OB ROS                           Anesthesia Physical Anesthesia Plan  ASA: II  Anesthesia Plan: General   Post-op Pain Management:    Induction:   PONV Risk Score and Plan: Propofol infusion and TIVA  Airway Management Planned: Nasal Cannula and Natural Airway  Additional Equipment:   Intra-op Plan:   Post-operative Plan:   Informed Consent: I have reviewed the patients History and Physical, chart, labs and discussed the procedure including the risks, benefits and alternatives for the proposed anesthesia with the patient or authorized representative who has indicated his/her understanding and acceptance.     Dental Advisory Given  Plan Discussed with: Anesthesiologist and CRNA  Anesthesia Plan Comments:         Anesthesia Quick Evaluation

## 2018-03-24 NOTE — Op Note (Signed)
[facility] Gastroenterology Patient Name: Gloria Harrison Procedure Date: 03/24/2018 9:03 AM MRN: [id number] Account #: [id number] Date of Birth: 02/26/1965 Admit Type: Outpatient Age: 53 Room: [facility] ENDO ROOM 4 Gender: Female Note Status: Finalized Procedure:            Upper GI endoscopy Indications:          Heartburn, Esophageal reflux Providers:            Varnita B. Bonna Gains MD, MD Referring MD:         Olin Hauser (Referring MD) Medicines:            Monitored Anesthesia Care Complications:        No immediate complications. Procedure:            Pre-Anesthesia Assessment:                       - Prior to the procedure, a History and Physical was                        performed, and patient medications, allergies and                        sensitivities were reviewed. The patient's tolerance of                        previous anesthesia was reviewed.                       - The risks and benefits of the procedure and the                        sedation options and risks were discussed with the                        patient. All questions were answered and informed                        consent was obtained.                       - Patient identification and proposed procedure were                        verified prior to the procedure by the physician, the                        nurse, the anesthesiologist, the anesthetist and the                        technician. The procedure was verified in the procedure                        room.                       - ASA Grade Assessment: II - A patient with mild                        systemic disease.  After obtaining informed consent, the endoscope was                        passed under direct vision. Throughout the procedure,                        the patient's blood pressure, pulse, and oxygen                        saturations were monitored continuously. The Endoscope                        was introduced through the mouth, and advanced to the                        second part of duodenum. The upper GI endoscopy was                        accomplished with ease. The patient tolerated the                        procedure well. Findings:      The Z-line was irregular and was found 35 cm from the incisors. Mucosa       was biopsied with a cold forceps for histology in 4 quadrants.      The exam of the esophagus was otherwise normal.      A single 4 mm sessile polyp with no bleeding and no stigmata of recent       bleeding was found in the gastric body. Biopsies were taken with a cold       forceps for histology.      The entire examined stomach was normal.      The duodenal bulb, second portion of the duodenum and examined duodenum       were normal. Impression:           - Z-line irregular, 35 cm from the incisors. Biopsied.                       - A single gastric polyp. Biopsied.                       - Normal stomach.                       - Normal duodenal bulb, second portion of the duodenum                        and examined duodenum. Recommendation:       - Await pathology results.                       - Discharge patient to home (with escort).                       - Advance diet as tolerated.                       - Continue present medications.                       - Patient has a contact number available for  emergencies. The signs and symptoms of potential                        delayed complications were discussed with the patient.                        Return to normal activities tomorrow. Written discharge                        instructions were provided to the patient.                       - Discharge patient to home (with escort).                       - The findings and recommendations were discussed with                        the patient.                       - The findings and recommendations were  discussed with                        the patient's family.                       - Follow an antireflux regimen. Procedure Code(s):    --- Professional ---                       [phone], Esophagogastroduodenoscopy, flexible, transoral;                        with biopsy, single or multiple Diagnosis Code(s):    --- Professional ---                       K22.8, Other specified diseases of esophagus                       K31.7, Polyp of stomach and duodenum                       R12, Heartburn                       K21.9, Gastro-esophageal reflux disease without                        esophagitis CPT copyright 2018 American Medical Association. All rights reserved. The codes documented in this report are preliminary and upon coder review may  be revised to meet current compliance requirements.  [name], MD Margretta Sidle B. Bonna Gains MD, MD 03/24/2018 9:29:04 AM This report has been signed electronically. Number of Addenda: 0 Note Initiated On: 03/24/2018 9:03 AM Estimated Blood Loss: Estimated blood loss: none.      [facility]

## 2018-03-24 NOTE — Anesthesia Post-op Follow-up Note (Signed)
Anesthesia QCDR form completed.        

## 2018-03-25 ENCOUNTER — Encounter: Payer: Self-pay | Admitting: Gastroenterology

## 2018-03-25 LAB — SURGICAL PATHOLOGY

## 2018-03-25 NOTE — Anesthesia Postprocedure Evaluation (Signed)
Anesthesia Post Note  Patient: Gloria Harrison  Procedure(s) Performed: COLONOSCOPY WITH PROPOFOL (N/A ) ESOPHAGOGASTRODUODENOSCOPY (EGD) WITH PROPOFOL (N/A )  Patient location during evaluation: PACU Anesthesia Type: General Level of consciousness: awake and alert Pain management: pain level controlled Vital Signs Assessment: post-procedure vital signs reviewed and stable Respiratory status: spontaneous breathing, nonlabored ventilation, respiratory function stable and patient connected to nasal cannula oxygen Cardiovascular status: blood pressure returned to baseline and stable Postop Assessment: no apparent nausea or vomiting Anesthetic complications: no     Last Vitals:  Vitals:   03/24/18 1020 03/24/18 1030  BP: 95/62 (!) 101/56  Pulse: (!) 104 (!) 105  Resp: 12 20  Temp:    SpO2: 100% 97%    Last Pain:  Vitals:   03/24/18 0849  TempSrc: Tympanic  PainSc: 0-No pain                 Durenda Hurt

## 2018-03-26 ENCOUNTER — Encounter: Payer: Self-pay | Admitting: Gastroenterology

## 2018-04-29 ENCOUNTER — Encounter: Payer: Self-pay | Admitting: Family Medicine

## 2018-04-29 ENCOUNTER — Ambulatory Visit (INDEPENDENT_AMBULATORY_CARE_PROVIDER_SITE_OTHER): Payer: 59 | Admitting: Family Medicine

## 2018-04-29 ENCOUNTER — Other Ambulatory Visit: Payer: Self-pay

## 2018-04-29 DIAGNOSIS — F411 Generalized anxiety disorder: Secondary | ICD-10-CM

## 2018-04-29 DIAGNOSIS — F331 Major depressive disorder, recurrent, moderate: Secondary | ICD-10-CM

## 2018-04-29 DIAGNOSIS — G2581 Restless legs syndrome: Secondary | ICD-10-CM

## 2018-04-29 MED ORDER — GABAPENTIN 100 MG PO CAPS: ORAL_CAPSULE | ORAL | 1 refills | Status: DC

## 2018-04-29 MED ORDER — ROPINIROLE HCL 2 MG PO TABS: ORAL_TABLET | ORAL | 2 refills | Status: DC

## 2018-04-29 NOTE — Progress Notes (Signed)
Virtual Visit via Telephone The purpose of this virtual visit is to provide medical care while limiting exposure to the novel coronavirus (COVID19) for both patient and office staff.  Consent was obtained for phone visit:  Yes.   Answered questions that patient had about telehealth interaction:  Yes.   I discussed the limitations, risks, security and privacy concerns of performing an evaluation and management service by telephone. I also discussed with the patient that there may be a patient responsible charge related to this service. The patient expressed understanding and agreed to proceed.  Patient Location: Home Provider Location: [facility])  ---------------------------------------------------------------------- Chief Complaint  Patient presents with  . Leg Pain    RLS, pt states her legs jump and twist all night long. She state she stop taking the Requip, because no improvement. She attempted to increase the Requip admits taking a total of 5 pills, but still no improvement.   . Anxiety    pt currently taking Celexa for her OCD    S: Reviewed CMA telephone note below. I have called patient and gathered additional HPI as follows:  Restless Leg Syndrome - Last visit with me 12/08/17, for initial visit for same problem RLS, treated with continued Ropinirol 0.25mg , see prior notes for background information. - Interval update with initial dramatic improvement significant relief on one pill 0.25mg  nightly Ropinirole only worked great for months since earlier in 2019, then recently past few weeks seems less effective of ropinirole, was not working for her RLS symptoms and difficulty falling asleep. - Recent few nights very poor sleep unable to fall asleep at all due to RLS keeping her awake. She could not find relief, tried increasing dose 0.25 ropinirole up to 1.25mg  per dose or 5 pills - she also tried warm bath, massage, ice packs, massage, Tylenol, muscle rub,  sleep aid QHS - Describes episodes at night with legs are "crawling, jerking and twitching" cannot relax also anxiety flared recently with worry - last 2 nights no sleep Denies leg pain, redness, swelling, numbness weakness tingling  Obessive-Compulsive Disorder / Generalized Anxiety w/o panic / Major Depression recurrent moderate - Last visit with me for this problem 11/2017, treated with continued Celexa 40mg  daily, see prior notes for background information. - Today patient reports concern with still significant anxiety seems worse now with community concerns of COVID19 - she is worried about her health and her son and family - Continues current med Failed Wellbutrin in past from 2018, not effective Not seeing Psychiatry Denies worsening depression, see scores below  Patient is currently still working but trying best for social distancing and otherwise does home isolation Denies any high risk travel to areas of current concern for [place]. Denies any known or suspected exposure to person with or possibly with COVID19.  Denies any fevers, chills, sweats, body ache, cough, shortness of breath, sinus pain or pressure, headache, abdominal pain, diarrhea  Past Medical History:  Diagnosis Date  . Chronic kidney disease   . GERD (gastroesophageal reflux disease)   . OCD (obsessive compulsive disorder)    Social History   Tobacco Use  . Smoking status: Never Smoker  . Smokeless tobacco: Never Used  Substance Use Topics  . Alcohol use: No    Alcohol/week: 0.0 standard drinks  . Drug use: No    Current Outpatient Medications:  .  citalopram (CELEXA) 40 MG tablet, Take 1 tablet (40 mg total) by mouth daily., Disp: 90 tablet, Rfl: 1 .  pantoprazole (PROTONIX)  40 MG tablet, Take 1 tablet (40 mg total) by mouth daily before breakfast., Disp: 90 tablet, Rfl: 1 .  XIIDRA 5 % SOLN, , Disp: , Rfl:  .  gabapentin (NEURONTIN) 100 MG capsule, Start 1 capsule at night as needed for RLS, may  increase by 1 cap every 2-3 days as tolerated up 3 caps = 300mg  at bedtime as needed, Disp: 90 capsule, Rfl: 1 .  rOPINIRole (REQUIP) 2 MG tablet, Start with half tablet nightly = 1mg , after 3-5 nights can take 1 whole tab = 2mg  nightly, eventually can take up to max of 4mg  nightly, Disp: 60 tablet, Rfl: 2  Depression screen [facility] 2/9 04/29/2018 12/08/2017 06/02/2017  Decreased Interest 1 0 0  Down, Depressed, Hopeless 2 0 0  PHQ - 2 Score 3 0 0  Altered sleeping 3 0 1  Tired, decreased energy 2 0 1  Change in appetite 2 0 0  Feeling bad or failure about yourself  2 0 1  Trouble concentrating 0 0 0  Moving slowly or fidgety/restless 0 0 0  Suicidal thoughts 0 0 0  PHQ-9 Score 12 0 3  Difficult doing work/chores Somewhat difficult Not difficult at all Not difficult at all    GAD 7 : Generalized Anxiety Score 04/29/2018 06/02/2017  Nervous, Anxious, on Edge 3 1  Control/stop worrying 3 1  Worry too much - different things 3 1  Trouble relaxing 3 1  Restless 1 1  Easily annoyed or irritable 2 1  Afraid - awful might happen 2 1  Total GAD 7 Score 17 7  Anxiety Difficulty Somewhat difficult Somewhat difficult    -------------------------------------------------------------------------- O: No physical exam performed due to remote telephone encounter.  Lab results reviewed.  CBC Latest Ref Rng & Units 06/10/2017 02/19/2017 08/09/2014  WBC 3.8 - 10.8 Thousand/uL 6.4 9.1 9.9  Hemoglobin 11.7 - 15.5 g/dL 12.9 12.9 13.1  Hematocrit 35.0 - 45.0 % 38.0 38.3 39.9  Platelets 140 - 400 Thousand/uL 268 276 304     Chemistry      Component Value Date/Time   NA 138 06/10/2017 0811   NA 141 08/09/2014 1653   NA 141 12/31/2011 0124   K 4.1 06/10/2017 0811   K 3.5 12/31/2011 0124   CL 105 06/10/2017 0811   CL 109 (H) 12/31/2011 0124   CO2 28 06/10/2017 0811   CO2 24 12/31/2011 0124   BUN 12 06/10/2017 0811   BUN 11 08/09/2014 1653   BUN 16 12/31/2011 0124   CREATININE 0.84 06/10/2017 0811       Component Value Date/Time   CALCIUM 9.3 06/10/2017 0811   CALCIUM 9.1 12/31/2011 0124   ALKPHOS 62 08/09/2014 1653   ALKPHOS 68 12/31/2011 0124   AST 9 (L) 02/23/2017 1650   AST 19 12/31/2011 0124   ALT 9 02/23/2017 1650   ALT 20 12/31/2011 0124   BILITOT 0.2 02/23/2017 1650   BILITOT 0.3 08/09/2014 1653   BILITOT 0.4 12/31/2011 0124       -------------------------------------------------------------------------- A&P:  Problem List Items Addressed This Visit    GAD (generalized anxiety disorder)    Primary underlying symptoms of OCD - now worse with COVID19 risk in community Comorbid depression / OCD Continue on Celexa 40mg  daily Encourage continue with counselor / therapist - in future as indicated      Moderate episode of recurrent major depressive disorder ([place])    Previously better controlled, secondary to anxiety, now worse due to poor sleep, RLS, and  anxiety w/ COVID19 risk - On SSRI Celexa still See A&P      Restless leg syndrome - Primary    Worsening RLS, seems worse with anxiety and poor sleep as well Failed higher dose ropinirole now up to 1.25mg  using her low dose pills 0.25mg , had worked for months  Plan - Continue to adjust dosage on medicine - new rx titration Ropinirole 2mg  tablets - start with HALF tab = 1mg  and add her existing 0.25mg  x 2-3 pills for higher dose 1.5 to 1.75 - for several nights after 3-7 nights if tolerated but need higher dose can take ONE WHOLE PILL Ropinirol 2mg  - then eventually keep titrating up every 1 week to 3mg  then to MAX DOSE = 4mg   - Additionally discussed gabapentin - added on Gabapentin low dose 100mg  capsules start 1 nightly PRN for breakthrough RLS insomnia, can titrate up to 200 then 300mg  as tolerated, may benefit from this for sedation and sleep considerations  Notify office with preferred regimen after she titrate both medicine, we can also consider XL form of both meds in future  Add orders for Magnesium and  Ferritin to upcoming labs in May - will likely have to re-schedule  If still ineffective would consider referral to Neurology for 2nd opinion      Relevant Medications   rOPINIRole (REQUIP) 2 MG tablet   gabapentin (NEURONTIN) 100 MG capsule   Other Relevant Orders   Ferritin   Magnesium      Meds ordered this encounter  Medications  . rOPINIRole (REQUIP) 2 MG tablet    Sig: Start with half tablet nightly = 1mg , after 3-5 nights can take 1 whole tab = 2mg  nightly, eventually can take up to max of 4mg  nightly    Dispense:  60 tablet    Refill:  2  . gabapentin (NEURONTIN) 100 MG capsule    Sig: Start 1 capsule at night as needed for RLS, may increase by 1 cap every 2-3 days as tolerated up 3 caps = 300mg  at bedtime as needed    Dispense:  90 capsule    Refill:  1    Follow-up: - Return in 2-3 months for annual physical - Future labs ordered for Magnesium / Ferritin - added to future orders for upcoming physical May to June 2020 will likely have to re-schedule annual and labs to June or July  Patient verbalizes understanding with the above medical recommendations including the limitation of remote medical advice.  Specific follow-up and call-back criteria were given for patient to follow-up or seek medical care more urgently if needed.  - Time spent in direct consultation with patient on phone: 17 minutes  [name], [facility] 04/29/2018, 8:55 AM

## 2018-04-29 NOTE — Assessment & Plan Note (Addendum)
Previously better controlled, secondary to anxiety, now worse due to poor sleep, RLS, and anxiety w/ COVID19 risk - On SSRI Celexa still See A&P

## 2018-04-29 NOTE — Assessment & Plan Note (Signed)
Primary underlying symptoms of OCD - now worse with COVID19 risk in community Comorbid depression / OCD Continue on Celexa 40mg  daily Encourage continue with counselor / therapist - in future as indicated

## 2018-04-29 NOTE — Assessment & Plan Note (Signed)
Worsening RLS, seems worse with anxiety and poor sleep as well Failed higher dose ropinirole now up to 1.25mg  using her low dose pills 0.25mg , had worked for months  Plan - Continue to adjust dosage on medicine - new rx titration Ropinirole 2mg  tablets - start with HALF tab = 1mg  and add her existing 0.25mg  x 2-3 pills for higher dose 1.5 to 1.75 - for several nights after 3-7 nights if tolerated but need higher dose can take ONE WHOLE PILL Ropinirol 2mg  - then eventually keep titrating up every 1 week to 3mg  then to MAX DOSE = 4mg   - Additionally discussed gabapentin - added on Gabapentin low dose 100mg  capsules start 1 nightly PRN for breakthrough RLS insomnia, can titrate up to 200 then 300mg  as tolerated, may benefit from this for sedation and sleep considerations  Notify office with preferred regimen after she titrate both medicine, we can also consider XL form of both meds in future  Add orders for Magnesium and Ferritin to upcoming labs in May - will likely have to re-schedule  If still ineffective would consider referral to Neurology for 2nd opinion

## 2018-04-29 NOTE — Patient Instructions (Addendum)
For RLS - Continue to adjust dosage on medicine - new rx titration Ropinirole 2mg  tablets - start with HALF tab = 1mg  and add her existing 0.25mg  x 2-3 pills for higher dose 1.5 to 1.75 - for several nights after 3-7 nights if tolerated but need higher dose can take ONE WHOLE PILL Ropinirol 2mg  - then eventually keep titrating up every 1 week to 3mg  then to MAX DOSE = 4mg   Add Gabapentin - 100mg  nightly as needed for RLS / insomnia, caution sedation, may increase up to 200mg  then up to 300mg  as tolerated over course of 1 week if need - can use as breakthrough ONLY if worse symptoms one night OR can switch to NIGHTLY if preferred  Notify us when you decide on which dosage works best for which med  Added labs magnesium ferritin - we will likely RESCHEDULE your upcoming lab and visit in May 2020 - call us before to check status.   If you have any other questions or concerns, please feel free to call the office or send a message through [place]. You may also schedule an earlier appointment if necessary.  Additionally, you may be receiving a survey about your experience at our office within a few days to 1 week by e-mail or mail. We value your feedback.  [name], DO [place]

## 2018-04-30 ENCOUNTER — Encounter: Payer: Self-pay | Admitting: Gastroenterology

## 2018-04-30 ENCOUNTER — Ambulatory Visit (INDEPENDENT_AMBULATORY_CARE_PROVIDER_SITE_OTHER): Payer: 59 | Admitting: Gastroenterology

## 2018-04-30 ENCOUNTER — Telehealth: Payer: Self-pay

## 2018-04-30 DIAGNOSIS — K219 Gastro-esophageal reflux disease without esophagitis: Secondary | ICD-10-CM

## 2018-04-30 NOTE — Telephone Encounter (Signed)
Last virtual telephone visit yesterday 04/29/18, see note, started low dose gabapentin and higher dose requip.  She had nausea vomiting last night 20 min after took both med before bed for RLS  She is not sure if ate something that upset her stomach at all  She had video visit with GI today as well, similar topic  I called her and advised may try separating dosage taking Gabapentin earlier with meal and Requip later, can try again or try changing timing / and w/ food first if still ineffective and side effect - it is a potential but uncommon side effect of gabapentin, then we may need to try other med, may just try requip alone for longer period, or can consider Gabapentin extended [facility] as an option  [name], DO [facility] 04/30/2018, 12:41 PM

## 2018-04-30 NOTE — Telephone Encounter (Signed)
As per patient was Rx gabapentin and Requip yesterday and she  Took both last night and started throwing up don't know which one she has side effect from. Please suggest ?

## 2018-04-30 NOTE — Progress Notes (Signed)
[name], MD [address]  [place]  [place], [place] [address]  Main: [phone]  Fax: [phone]   Primary Care Physician: Olin Hauser, DO  Virtual Visit via Video Note  I connected with patient on 04/30/18 at  9:00 AM EDT by video (using [email or web address]) and verified that I am speaking with the correct person using two identifiers.   I discussed the limitations, risks, security and privacy concerns of performing an evaluation and management service by video and the availability of in person appointments. I also discussed with the patient that there may be a patient responsible charge related to this service. The patient expressed understanding and agreed to proceed.  Location of Patient: Home Location of Provider: Home Persons involved: Patient and provider only   History of Present Illness: CC: Reflux  HPI: Gloria Harrison is a 53 y.o. female initially seen for reflux symptoms.  Patient underwent EGD due to chronic reflux to rule out Barrett's, and colonoscopy for screening.  One benign gastric fundic polyp seen on EGD, irregular Z line which did not show intestinal metaplasia on biopsies.  Screening colonoscopy with 1 subcentimeter polyp removed and showed tubular adenoma, with repeat recommended in 5 years.  Patient has been taking Protonix once daily which controls her symptoms well.  She states if she misses even 1 dose, she has significant symptoms.  As long as she takes it once daily her symptoms are very well controlled.  She states she has tried other medications in the past including H2 RA and they do not provide any relief.  She would like to continue to stay on this medication.  Recently, she has had restless leg symptoms exacerbation, therefore she contacted her primary care provider and they have given her Requip and gabapentin.  States after taking these medications she had some nausea and vomiting, but no diarrhea.  This only occurred x1 and  has not reoccurred and she has notified her primary care provider.  The patient denies abdominal or flank pain, anorexia, dysphagia, change in bowel habits or black or bloody stools or weight loss.   Current Outpatient Medications  Medication Sig Dispense Refill  . citalopram (CELEXA) 40 MG tablet Take 1 tablet (40 mg total) by mouth daily. 90 tablet 1  . gabapentin (NEURONTIN) 100 MG capsule Start 1 capsule at night as needed for RLS, may increase by 1 cap every 2-3 days as tolerated up 3 caps = 300mg  at bedtime as needed 90 capsule 1  . pantoprazole (PROTONIX) 40 MG tablet Take 1 tablet (40 mg total) by mouth daily before breakfast. 90 tablet 1  . rOPINIRole (REQUIP) 2 MG tablet Start with half tablet nightly = 1mg , after 3-5 nights can take 1 whole tab = 2mg  nightly, eventually can take up to max of 4mg  nightly 60 tablet 2  . XIIDRA 5 % SOLN      No current facility-administered medications for this visit.     Allergies as of 04/30/2018 - Review Complete 04/30/2018  Allergen Reaction Noted  . Ciprofloxacin Hives 08/09/2014    Review of Systems:    All systems reviewed and negative except where noted in HPI.   Observations/Objective:  Labs: CMP     Component Value Date/Time   NA 138 06/10/2017 0811   NA 141 08/09/2014 1653   NA 141 12/31/2011 0124   K 4.1 06/10/2017 0811   K 3.5 12/31/2011 0124   CL 105 06/10/2017 0811   CL 109 (H)  12/31/2011 0124   CO2 28 06/10/2017 0811   CO2 24 12/31/2011 0124   GLUCOSE 108 (H) 06/10/2017 0811   GLUCOSE 131 (H) 12/31/2011 0124   BUN 12 06/10/2017 0811   BUN 11 08/09/2014 1653   BUN 16 12/31/2011 0124   CREATININE 0.84 06/10/2017 0811   CALCIUM 9.3 06/10/2017 0811   CALCIUM 9.1 12/31/2011 0124   PROT 6.7 02/23/2017 1650   PROT 6.9 08/09/2014 1653   PROT 6.8 12/31/2011 0124   ALBUMIN 4.4 08/09/2014 1653   ALBUMIN 3.8 12/31/2011 0124   AST 9 (L) 02/23/2017 1650   AST 19 12/31/2011 0124   ALT 9 02/23/2017 1650   ALT 20  12/31/2011 0124   ALKPHOS 62 08/09/2014 1653   ALKPHOS 68 12/31/2011 0124   BILITOT 0.2 02/23/2017 1650   BILITOT 0.3 08/09/2014 1653   BILITOT 0.4 12/31/2011 0124   GFRNONAA 82 06/10/2017 0811   GFRAA 95 06/10/2017 0811   Lab Results  Component Value Date   WBC 6.4 06/10/2017   HGB 12.9 06/10/2017   HCT 38.0 06/10/2017   MCV 84.4 06/10/2017   PLT 268 06/10/2017    Imaging Studies: No results found.  Assessment and Plan:   Gloria Harrison is a 53 y.o. y/o female with reflux  Assessment and Plan: Symptoms well controlled with once daily PPI H2 RA attempted in the past without any relief Patient would like to continue on the Protonix once daily and understands the side effects associated with the medications.  I did discuss retrying Pepcid due to the lesser side effects, but patient does not want to go back on the Pepcid and wants to continue on Protonix once daily.  (Risks of PPI use were discussed with patient including bone loss, C. Diff diarrhea, pneumonia, infections, CKD, electrolyte abnormalities.  Pt. Verbalizes understanding and chooses to continue the medication.)  Patient educated extensively on acid reflux lifestyle modification, including buying a bed wedge, not eating 3 hrs before bedtime, diet modifications, and handout given for the same.   Patient had an isolated episode of nausea and vomiting after taking Requip and gabapentin together.  Nausea is listed as 1 of the side effects for Requip, and therefore I have asked her to follow-up with her primary care provider in this regard to discuss altering her medications as needed.  Symptoms have not reoccurred since that one time.  Follow Up Instructions: Continue clinic follow-up as needed or follow-up in 1 year   I discussed the assessment and treatment plan with the patient. The patient was provided an opportunity to ask questions and all were answered. The patient agreed with the plan and demonstrated an  understanding of the instructions.   The patient was advised to call back or seek an in-person evaluation if the symptoms worsen or if the condition fails to improve as anticipated.  I provided 10 minutes of non-face-to-face time during this encounter.   Virgel Manifold, MD  Speech recognition software was used to dictate this note.

## 2018-05-06 ENCOUNTER — Telehealth: Payer: Self-pay | Admitting: Family Medicine

## 2018-05-06 NOTE — Telephone Encounter (Signed)
I tried to call her back - did not reach her- I left a voicemail.  Regarding her anxiety medication, Celexa 40mg  once daily is the max dose of that medication.  For restless leg - Recommend that she continued to increase Gabapentin up to max tolerate dose in evening - if taking 3 of the 100mg  capsules nightly - and still not helping - can increase up to 4 to 6 pills for total nightly dose of 400 to 600mg  MAX - only if not too sedated - if this is too sedating she can keep at a lower dose such as 1 to 3 pills - as she was previously instructed.  We can continue to adjust Ropinirole as well if need.  If she would like to discuss further new medications and other concerns or more advanced questions - other than dosing changes - then she should schedule another Virtual Telephone visit.  [name], [facility] 05/06/2018, 12:56 PM

## 2018-05-06 NOTE — Telephone Encounter (Signed)
Pt. Called said that the medication for restless leg was not  Working, she wanted to know if she can increase her anxiety medication. Pt  Call back # is [phone]

## 2018-05-07 NOTE — Telephone Encounter (Signed)
Left message

## 2018-05-10 ENCOUNTER — Other Ambulatory Visit: Payer: Self-pay

## 2018-05-10 ENCOUNTER — Telehealth: Payer: Self-pay

## 2018-05-10 NOTE — Telephone Encounter (Signed)
As per patient, her Anxiety is getting worst since staying home but doesn't want any changes she is taking Gabapentin 100 mg x 2 at night which works better from past 2-3 nights. She will call us if she decided to increase gabapentin, advised as per Dr. Raliegh Ip about increasing dosage. She can not take Ropinirole made her nauseated and doesn't want to go back on it.  Advised for virtual visit for future if this doesn't work.

## 2018-05-10 NOTE — Telephone Encounter (Signed)
Patient wants to add Ropinirole as in her allergic list.

## 2018-05-10 NOTE — Telephone Encounter (Signed)
Acknowledged. Will stay tuned. She was tolerating lower dose Requip okay, now with higher dose had nausea. Added as intolerance. Need to clarify next time if she is still on any requip or if she is completely off.  She should continue Gabapentin at night, 200mg  (100mg  x 2) and can increase as tolerated up to 300+  [name], DO [facility] 05/10/2018, 1:32 PM

## 2018-05-12 ENCOUNTER — Telehealth: Payer: Self-pay | Admitting: Gastroenterology

## 2018-05-12 ENCOUNTER — Ambulatory Visit: Payer: 59 | Admitting: Gastroenterology

## 2018-05-12 NOTE — Telephone Encounter (Signed)
I called patient to r/s her appt from 05-12-18 to either a Video/tele visit or an appt for 3-4- mons per Dr Darene Lamer

## 2018-06-04 ENCOUNTER — Other Ambulatory Visit: Payer: 59

## 2018-06-11 ENCOUNTER — Encounter: Payer: 59 | Admitting: Family Medicine

## 2018-07-21 ENCOUNTER — Other Ambulatory Visit: Payer: Self-pay | Admitting: Family Medicine

## 2018-07-21 DIAGNOSIS — G2581 Restless legs syndrome: Secondary | ICD-10-CM

## 2018-07-21 MED ORDER — GABAPENTIN 100 MG PO CAPS: 200.0000 mg | ORAL_CAPSULE | Freq: Every evening | ORAL | 1 refills | Status: DC | PRN

## 2018-08-17 ENCOUNTER — Other Ambulatory Visit: Payer: Self-pay | Admitting: Family Medicine

## 2018-08-17 DIAGNOSIS — K219 Gastro-esophageal reflux disease without esophagitis: Secondary | ICD-10-CM

## 2018-08-18 MED ORDER — PANTOPRAZOLE SODIUM 40 MG PO TBEC: 40.0000 mg | DELAYED_RELEASE_TABLET | Freq: Every day | ORAL | 1 refills | Status: DC

## 2018-08-30 ENCOUNTER — Other Ambulatory Visit: Payer: Self-pay | Admitting: Family Medicine

## 2018-08-30 DIAGNOSIS — F428 Other obsessive-compulsive disorder: Secondary | ICD-10-CM

## 2018-11-04 ENCOUNTER — Other Ambulatory Visit: Payer: Self-pay | Admitting: Family Medicine

## 2018-11-04 DIAGNOSIS — E782 Mixed hyperlipidemia: Secondary | ICD-10-CM

## 2018-11-04 DIAGNOSIS — F331 Major depressive disorder, recurrent, moderate: Secondary | ICD-10-CM

## 2018-11-04 DIAGNOSIS — E559 Vitamin D deficiency, unspecified: Secondary | ICD-10-CM

## 2018-11-04 DIAGNOSIS — Z0000 Encounter for general adult medical examination without abnormal findings: Secondary | ICD-10-CM

## 2018-11-04 DIAGNOSIS — F411 Generalized anxiety disorder: Secondary | ICD-10-CM

## 2018-11-04 DIAGNOSIS — R7309 Other abnormal glucose: Secondary | ICD-10-CM

## 2018-11-05 ENCOUNTER — Other Ambulatory Visit: Payer: 59

## 2018-11-05 ENCOUNTER — Other Ambulatory Visit: Payer: Self-pay

## 2018-11-05 DIAGNOSIS — R7309 Other abnormal glucose: Secondary | ICD-10-CM

## 2018-11-05 DIAGNOSIS — Z0000 Encounter for general adult medical examination without abnormal findings: Secondary | ICD-10-CM

## 2018-11-05 DIAGNOSIS — E782 Mixed hyperlipidemia: Secondary | ICD-10-CM

## 2018-11-05 DIAGNOSIS — E559 Vitamin D deficiency, unspecified: Secondary | ICD-10-CM

## 2018-11-05 DIAGNOSIS — F331 Major depressive disorder, recurrent, moderate: Secondary | ICD-10-CM

## 2018-11-06 LAB — CBC WITH DIFFERENTIAL/PLATELET
Absolute Monocytes: 398 cells/uL (ref 200–950)
Basophils Absolute: 41 cells/uL (ref 0–200)
Basophils Relative: 0.8 %
Eosinophils Absolute: 112 cells/uL (ref 15–500)
Eosinophils Relative: 2.2 %
HCT: 38 % (ref 35.0–45.0)
Hemoglobin: 12.5 g/dL (ref 11.7–15.5)
Lymphs Abs: 928 cells/uL (ref 850–3900)
MCH: 27.8 pg (ref 27.0–33.0)
MCHC: 32.9 g/dL (ref 32.0–36.0)
MCV: 84.4 fL (ref 80.0–100.0)
MPV: 10.5 fL (ref 7.5–12.5)
Monocytes Relative: 7.8 %
Neutro Abs: 3621 cells/uL (ref 1500–7800)
Neutrophils Relative %: 71 %
Platelets: 296 10*3/uL (ref 140–400)
RBC: 4.5 10*6/uL (ref 3.80–5.10)
RDW: 14.4 % (ref 11.0–15.0)
Total Lymphocyte: 18.2 %
WBC: 5.1 10*3/uL (ref 3.8–10.8)

## 2018-11-06 LAB — COMPLETE METABOLIC PANEL WITH GFR
AG Ratio: 2 (calc) (ref 1.0–2.5)
ALT: 9 U/L (ref 6–29)
AST: 10 U/L (ref 10–35)
Albumin: 4.2 g/dL (ref 3.6–5.1)
Alkaline phosphatase (APISO): 49 U/L (ref 37–153)
BUN: 15 mg/dL (ref 7–25)
CO2: 28 mmol/L (ref 20–32)
Calcium: 9.4 mg/dL (ref 8.6–10.4)
Chloride: 105 mmol/L (ref 98–110)
Creat: 0.85 mg/dL (ref 0.50–1.05)
GFR, Est African American: 92 mL/min/{1.73_m2} (ref >=60)
GFR, Est Non African American: 79 mL/min/{1.73_m2} (ref >=60)
Globulin: 2.1 g/dL (calc) (ref 1.9–3.7)
Glucose, Bld: 108 mg/dL — ABNORMAL HIGH (ref 65–99)
Potassium: 4.1 mmol/L (ref 3.5–5.3)
Sodium: 139 mmol/L (ref 135–146)
Total Bilirubin: 0.4 mg/dL (ref 0.2–1.2)
Total Protein: 6.3 g/dL (ref 6.1–8.1)

## 2018-11-06 LAB — HEMOGLOBIN A1C
Hgb A1c MFr Bld: 5.6 % of total Hgb (ref <5.7)
Mean Plasma Glucose: 114 (calc)
eAG (mmol/L): 6.3 (calc)

## 2018-11-06 LAB — LIPID PANEL
Cholesterol: 249 mg/dL — ABNORMAL HIGH (ref <200)
HDL: 42 mg/dL — ABNORMAL LOW (ref >=50)
LDL Cholesterol (Calc): 177 mg/dL (calc) — ABNORMAL HIGH
Non-HDL Cholesterol (Calc): 207 mg/dL (calc) — ABNORMAL HIGH (ref <130)
Total CHOL/HDL Ratio: 5.9 (calc) — ABNORMAL HIGH (ref <5.0)
Triglycerides: 151 mg/dL — ABNORMAL HIGH (ref <150)

## 2018-11-06 LAB — VITAMIN D 25 HYDROXY (VIT D DEFICIENCY, FRACTURES): Vit D, 25-Hydroxy: 21 ng/mL — ABNORMAL LOW (ref 30–100)

## 2018-11-12 ENCOUNTER — Ambulatory Visit (INDEPENDENT_AMBULATORY_CARE_PROVIDER_SITE_OTHER): Payer: 59 | Admitting: Family Medicine

## 2018-11-12 ENCOUNTER — Other Ambulatory Visit: Payer: Self-pay

## 2018-11-12 ENCOUNTER — Encounter: Payer: Self-pay | Admitting: Family Medicine

## 2018-11-12 VITALS — BP 115/65 | HR 83 | Temp 98.6°F | Resp 16 | Ht 63.0 in | Wt 150.0 lb

## 2018-11-12 DIAGNOSIS — R7309 Other abnormal glucose: Secondary | ICD-10-CM

## 2018-11-12 DIAGNOSIS — F5104 Psychophysiologic insomnia: Secondary | ICD-10-CM

## 2018-11-12 DIAGNOSIS — F331 Major depressive disorder, recurrent, moderate: Secondary | ICD-10-CM

## 2018-11-12 DIAGNOSIS — Z0000 Encounter for general adult medical examination without abnormal findings: Secondary | ICD-10-CM

## 2018-11-12 DIAGNOSIS — G2581 Restless legs syndrome: Secondary | ICD-10-CM | POA: Diagnosis not present

## 2018-11-12 DIAGNOSIS — Z1231 Encounter for screening mammogram for malignant neoplasm of breast: Secondary | ICD-10-CM

## 2018-11-12 DIAGNOSIS — F411 Generalized anxiety disorder: Secondary | ICD-10-CM

## 2018-11-12 MED ORDER — HYDROXYZINE HCL 25 MG PO TABS: 25.0000 mg | ORAL_TABLET | Freq: Every evening | ORAL | 2 refills | Status: DC | PRN

## 2018-11-12 NOTE — Patient Instructions (Addendum)
Thank you for coming to the office today.  Recommend Mammogram screening - you can check back with [facility] locally to see if they can do this, or if you need me to order it, I can place orders for [facility]  For Mammogram screening for breast cancer   Call the [place] below anytime to schedule your own appointment now that order has been placed.  [facility] [place], [place] [address] Phone: [phone]  ------------------------  Hydroxyzine at night as needed for sleep / insomnia. Caution with sedation.  Dermatology for mole on back left lower bra area.  Low Vitamin D  Start OTC Vitamin D3 5,000 iu daily for 12 weeks then reduce to OTC Vitamin D3 2,000 iu daily for maintenance  DUE for FASTING BLOOD WORK (no food or drink after midnight before the lab appointment, only water or coffee without cream/sugar on the morning of)  SCHEDULE "Lab Only" visit in the morning at the clinic for lab draw in 1 YEAR  - Make sure Lab Only appointment is at about 1 week before your next appointment, so that results will be available  For Lab Results, once available within 2-3 days of blood draw, you can can log in to MyChart online to view your results and a brief explanation. Also, we can discuss results at next follow-up visit.   Please schedule a Follow-up Appointment to: Return in about 1 year (around 11/12/2019) for Annual Physical.  If you have any other questions or concerns, please feel free to call the office or send a message through [place]. You may also schedule an earlier appointment if necessary.  Additionally, you may be receiving a survey about your experience at our office within a few days to 1 week by e-mail or mail. We value your feedback.  [name], DO [place]

## 2018-11-12 NOTE — Progress Notes (Signed)
Subjective:    Patient ID: Gloria Harrison, female    DOB: 09/08/1965, 53 y.o.   MRN: [id number]  Gloria Harrison is a 53 y.o. female presenting on 11/12/2018 for Annual Exam   HPI   Here for Annual Physical and Lab Review.  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 Reports chronic history >15-20 years, previous dx by psychiatry, and ultimately her PCP was managing meds, describes symptom mostly intrusive thoughts that are related to anxieties for her and worry - Previously saw Psych and therapy - In past saw Sena Hitch therapist/counselor - [facility] ([place]), she can return when needed Failed Wellbutrin in past from 2018, not effective - Today reports significant work related stress contributing to anxiety but she is managing, and hopeful for improved conditions in future at work - Taking Celexa 40mg  daily, request refill - Admits insomnia, difficulty staying asleep, tried OTC melatonin no relief  Follow-up RLS Seems improved, was worse with onset COVID19 and anxiety stress and now back to work and in routine, seems eased. She is off ropinirole and gabapentin but takes gabapentin occasional only if need. Takes Tylenol PRN now with good results.  Additional updates - Lab show vitamin D low - request dosage on vitamin d supplement   Health Maintenance: UTD Pap smear, 02/23/17 - negative for malignancy or abnormal cells, and negative high risk HPV - Good for 3-5 years from that date, previous [facility]-OBGYN  UTD Influenza vaccine and TDap  Due for mammogram in 2020- previous was 2016 negative, [facility] she request order, will sign for release and fax to [facility]  Depression screen [facility] 2/9 11/12/2018 04/29/2018 12/08/2017  Decreased Interest 0 1 0  Down, Depressed, Hopeless 0 2 0  PHQ - 2 Score 0 3 0  Altered sleeping 0 3 0  Tired, decreased energy 1 2 0  Change in appetite 0 2 0  Feeling bad or failure about yourself  0 2 0   Trouble concentrating 0 0 0  Moving slowly or fidgety/restless 0 0 0  Suicidal thoughts 0 0 0  PHQ-9 Score 1 12 0  Difficult doing work/chores Not difficult at all Somewhat difficult Not difficult at all   GAD 7 : Generalized Anxiety Score 11/12/2018 04/29/2018 06/02/2017  Nervous, Anxious, on Edge 1 3 1   Control/stop worrying 1 3 1   Worry too much - different things 1 3 1   Trouble relaxing 1 3 1   Restless 0 1 1  Easily annoyed or irritable 0 2 1  Afraid - awful might happen 0 2 1  Total GAD 7 Score 4 17 7   Anxiety Difficulty Not difficult at all Somewhat difficult Somewhat difficult      Past Medical History:  Diagnosis Date  . Chronic kidney disease   . GERD (gastroesophageal reflux disease)   . OCD (obsessive compulsive disorder)    Past Surgical History:  Procedure Laterality Date  . CESAREAN SECTION  1996  . CESAREAN SECTION  2001  . COLONOSCOPY WITH PROPOFOL N/A 03/24/2018   Procedure: COLONOSCOPY WITH PROPOFOL;  Surgeon: Virgel Manifold, MD;  Location: [facility] ENDOSCOPY;  Service: Endoscopy;  Laterality: N/A;  . COSMETIC SURGERY  2002   Tummy tuck  . ESOPHAGOGASTRODUODENOSCOPY (EGD) WITH PROPOFOL N/A 03/24/2018   Procedure: ESOPHAGOGASTRODUODENOSCOPY (EGD) WITH PROPOFOL;  Surgeon: Virgel Manifold, MD;  Location: [facility] ENDOSCOPY;  Service: Endoscopy;  Laterality: N/A;  . TONSILLECTOMY  1990   Social History   Socioeconomic History  . Marital  status: Married    Spouse name: Not on file  . Number of children: Not on file  . Years of education: Not on file  . Highest education level: Not on file  Occupational History  . Occupation: Special educational needs teacher  Social Needs  . Financial resource strain: Not on file  . Food insecurity    Worry: Not on file    Inability: Not on file  . Transportation needs    Medical: Not on file    Non-medical: Not on file  Tobacco Use  . Smoking status: Never Smoker  . Smokeless tobacco: Never Used  Substance and Sexual  Activity  . Alcohol use: No    Alcohol/week: 0.0 standard drinks  . Drug use: No  . Sexual activity: Yes    Partners: Male    Birth control/protection: Other-see comments    Comment: Husband had a Vasectomy.  Lifestyle  . Physical activity    Days per week: Not on file    Minutes per session: Not on file  . Stress: Not on file  Relationships  . Social Herbalist on phone: Not on file    Gets together: Not on file    Attends religious service: Not on file    Active member of club or organization: Not on file    Attends meetings of clubs or organizations: Not on file    Relationship status: Not on file  . Intimate partner violence    Fear of current or ex partner: Not on file    Emotionally abused: Not on file    Physically abused: Not on file    Forced sexual activity: Not on file  Other Topics Concern  . Not on file  Social History Narrative  . Not on file   Family History  Problem Relation Age of Onset  . Kidney Stones Mother   . Hyperlipidemia Mother   . Diabetes Father   . Hyperlipidemia Father   . Hypertension Father   . Asthma Son   . Suicidality Son   . Depression Son   . Hypothyroidism Sister   . Colon cancer Neg Hx    Current Outpatient Medications on File Prior to Visit  Medication Sig  . citalopram (CELEXA) 40 MG tablet Take 1 tablet by mouth once daily  . pantoprazole (PROTONIX) 40 MG tablet Take 1 tablet (40 mg total) by mouth daily before breakfast.  . XIIDRA 5 % SOLN    No current facility-administered medications on file prior to visit.     Review of Systems  Constitutional: Negative for activity change, appetite change, chills, diaphoresis, fatigue and fever.  HENT: Negative for congestion and hearing loss.   Eyes: Negative for visual disturbance.  Respiratory: Negative for cough, chest tightness, shortness of breath and wheezing.   Cardiovascular: Negative for chest pain, palpitations and leg swelling.  Gastrointestinal: Negative  for abdominal pain, constipation, diarrhea, nausea and vomiting.  Endocrine: Negative for cold intolerance.  Genitourinary: Negative for decreased urine volume, dysuria, frequency, hematuria and urgency.  Musculoskeletal: Negative for arthralgias, back pain and neck pain.  Skin: Negative for rash.  Allergic/Immunologic: Negative for environmental allergies.  Neurological: Negative for dizziness, weakness, light-headedness, numbness and headaches.  Hematological: Negative for adenopathy.  Psychiatric/Behavioral: Positive for sleep disturbance. Negative for behavioral problems and dysphoric mood. The patient is nervous/anxious.    Per HPI unless specifically indicated above      Objective:    BP 115/65   Pulse 83  Temp 98.6 F (37 C) (Oral)   Resp 16   Ht 5\' 3"  (1.6 m)   Wt 150 lb (68 kg)   BMI 26.57 kg/m   Wt Readings from Last 3 Encounters:  11/12/18 150 lb (68 kg)  03/24/18 160 lb (72.6 kg)  03/02/18 162 lb 12.8 oz (73.8 kg)    Physical Exam Vitals signs and nursing note reviewed.  Constitutional:      General: She is not in acute distress.    Appearance: She is well-developed. She is not diaphoretic.     Comments: Well-appearing, comfortable, cooperative  HENT:     Head: Normocephalic and atraumatic.  Eyes:     General:        Right eye: No discharge.        Left eye: No discharge.     Conjunctiva/sclera: Conjunctivae normal.     Pupils: Pupils are equal, round, and reactive to light.  Neck:     Musculoskeletal: Normal range of motion and neck supple.     Thyroid: No thyromegaly.  Cardiovascular:     Rate and Rhythm: Normal rate and regular rhythm.     Heart sounds: Normal heart sounds. No murmur.  Pulmonary:     Effort: Pulmonary effort is normal. No respiratory distress.     Breath sounds: Normal breath sounds. No wheezing or rales.  Abdominal:     General: Bowel sounds are normal. There is no distension.     Palpations: Abdomen is soft. There is no mass.      Tenderness: There is no abdominal tenderness.  Musculoskeletal: Normal range of motion.        General: No tenderness.     Comments: Upper / Lower Extremities: - Normal muscle tone, strength bilateral upper extremities 5/5, lower extremities 5/5  Lymphadenopathy:     Cervical: No cervical adenopathy.  Skin:    General: Skin is warm and dry.     Findings: No erythema or rash.  Neurological:     Mental Status: She is alert and oriented to person, place, and time.     Comments: Distal sensation intact to light touch all extremities  Psychiatric:        Behavior: Behavior normal.     Comments: Well groomed, good eye contact, normal speech and thoughts        Results for orders placed or performed in visit on 11/05/18  VITAMIN D 25 Hydroxy (Vit-D Deficiency, Fractures)  Result Value Ref Range   Vit D, 25-Hydroxy 21 (L) 30 - 100 ng/mL  Lipid panel  Result Value Ref Range   Cholesterol 249 (H) <200 mg/dL   HDL 42 (L) > OR = 50 mg/dL   Triglycerides 151 (H) <150 mg/dL   LDL Cholesterol (Calc) 177 (H) mg/dL (calc)   Total CHOL/HDL Ratio 5.9 (H) <5.0 (calc)   Non-HDL Cholesterol (Calc) 207 (H) <130 mg/dL (calc)  COMPLETE METABOLIC PANEL WITH GFR  Result Value Ref Range   Glucose, Bld 108 (H) 65 - 99 mg/dL   BUN 15 7 - 25 mg/dL   Creat 0.85 0.50 - 1.05 mg/dL   GFR, Est Non African American 79 > OR = 60 mL/min/1.23m2   GFR, Est African American 92 > OR = 60 mL/min/1.58m2   BUN/Creatinine Ratio NOT APPLICABLE 6 - 22 (calc)   Sodium 139 135 - 146 mmol/L   Potassium 4.1 3.5 - 5.3 mmol/L   Chloride 105 98 - 110 mmol/L   CO2 28 20 - 32 mmol/L  Calcium 9.4 8.6 - 10.4 mg/dL   Total Protein 6.3 6.1 - 8.1 g/dL   Albumin 4.2 3.6 - 5.1 g/dL   Globulin 2.1 1.9 - 3.7 g/dL (calc)   AG Ratio 2.0 1.0 - 2.5 (calc)   Total Bilirubin 0.4 0.2 - 1.2 mg/dL   Alkaline phosphatase (APISO) 49 37 - 153 U/L   AST 10 10 - 35 U/L   ALT 9 6 - 29 U/L  CBC with Differential/Platelet  Result Value Ref  Range   WBC 5.1 3.8 - 10.8 Thousand/uL   RBC 4.50 3.80 - 5.10 Million/uL   Hemoglobin 12.5 11.7 - 15.5 g/dL   HCT 38.0 35.0 - 45.0 %   MCV 84.4 80.0 - 100.0 fL   MCH 27.8 27.0 - 33.0 pg   MCHC 32.9 32.0 - 36.0 g/dL   RDW 14.4 11.0 - 15.0 %   Platelets 296 140 - 400 Thousand/uL   MPV 10.5 7.5 - 12.5 fL   Neutro Abs 3,621 1,500 - 7,800 cells/uL   Lymphs Abs 928 850 - 3,900 cells/uL   Absolute Monocytes 398 200 - 950 cells/uL   Eosinophils Absolute 112 15 - 500 cells/uL   Basophils Absolute 41 0 - 200 cells/uL   Neutrophils Relative % 71 %   Total Lymphocyte 18.2 %   Monocytes Relative 7.8 %   Eosinophils Relative 2.2 %   Basophils Relative 0.8 %  Hemoglobin A1c  Result Value Ref Range   Hgb A1c MFr Bld 5.6 <5.7 % of total Hgb   Mean Plasma Glucose 114 (calc)   eAG (mmol/L) 6.3 (calc)      Assessment & Plan:   Problem List Items Addressed This Visit    Elevated hemoglobin A1c   GAD (generalized anxiety disorder)   Relevant Medications   hydrOXYzine (ATARAX/VISTARIL) 25 MG tablet   Moderate episode of recurrent major depressive disorder (HCC)   Relevant Medications   hydrOXYzine (ATARAX/VISTARIL) 25 MG tablet   Restless leg syndrome    Other Visit Diagnoses    Annual physical exam    -  Primary   Encounter for screening mammogram for malignant neoplasm of breast       Relevant Orders   MM DIGITAL SCREENING BILATERAL   Psychophysiological insomnia       Relevant Medications   hydrOXYzine (ATARAX/VISTARIL) 25 MG tablet     Updated Health Maintenance information - Order Mammogram at [facility], patient to call to schedule, faxed release form today from [place] last result 2016 Reviewed recent lab results with patient Encouraged improvement to lifestyle with diet and exercise Maintain healthy weight  #Anxiety Controlled daily symptoms, has some assoc insomnia Continue Celexa Add Hydroxyzine 25mg  nightly PRN caution sedation for insomnia, infrequent use  #RLS  Remain off Gabapentin (worked best) and Ropinirole for now, has resolved with less stress actually, now taking Tylenol PRN with relief. Notify if need to resume meds.  Meds ordered this encounter  Medications  . hydrOXYzine (ATARAX/VISTARIL) 25 MG tablet    Sig: Take 1 tablet (25 mg total) by mouth at bedtime as needed for anxiety (insomnia).    Dispense:  30 tablet    Refill:  2    Follow up plan: Return in about 1 year (around 11/12/2019) for Annual Physical.  Future labs to be ordered 1 year.  [name], DO [facility] 11/12/2018, 3:52 PM

## 2018-11-25 ENCOUNTER — Other Ambulatory Visit: Payer: Self-pay | Admitting: Family Medicine

## 2018-11-25 DIAGNOSIS — Z1231 Encounter for screening mammogram for malignant neoplasm of breast: Secondary | ICD-10-CM

## 2019-02-16 ENCOUNTER — Ambulatory Visit
Admission: RE | Admit: 2019-02-16 | Discharge: 2019-02-16 | Disposition: A | Discharge status: Home / Self Care | Payer: 59 | Source: Ambulatory Visit | Attending: Family Medicine | Admitting: Family Medicine

## 2019-02-16 DIAGNOSIS — Z1231 Encounter for screening mammogram for malignant neoplasm of breast: Secondary | ICD-10-CM | POA: Diagnosis not present

## 2019-03-09 ENCOUNTER — Other Ambulatory Visit: Payer: Self-pay | Admitting: Family Medicine

## 2019-03-09 DIAGNOSIS — F428 Other obsessive-compulsive disorder: Secondary | ICD-10-CM

## 2019-03-28 ENCOUNTER — Other Ambulatory Visit: Payer: Self-pay | Admitting: Family Medicine

## 2019-03-28 DIAGNOSIS — K219 Gastro-esophageal reflux disease without esophagitis: Secondary | ICD-10-CM

## 2019-07-11 ENCOUNTER — Encounter: Payer: Self-pay | Admitting: Family Medicine

## 2019-07-11 ENCOUNTER — Ambulatory Visit (INDEPENDENT_AMBULATORY_CARE_PROVIDER_SITE_OTHER): Payer: 59 | Admitting: Family Medicine

## 2019-07-11 ENCOUNTER — Other Ambulatory Visit: Payer: Self-pay

## 2019-07-11 VITALS — BP 120/68 | HR 106 | Temp 97.8°F | Resp 16 | Ht 63.0 in | Wt 155.6 lb

## 2019-07-11 DIAGNOSIS — K645 Perianal venous thrombosis: Secondary | ICD-10-CM | POA: Diagnosis not present

## 2019-07-11 NOTE — Progress Notes (Signed)
Subjective:    Patient ID: Gloria Harrison, female    DOB: 1965-07-05, 54 y.o.   MRN: [id number]  Gloria Harrison is a 54 y.o. female presenting on 07/11/2019 for Hemorrhoids (onset week getting larger and bigger from past 3 days painful)  Patient presents for a same day appointment.  HPI   ACUTE INFLAMED EXTERNAL HEMORRHOID Onset 1 week one main swollen hemorrhoid. She says possible trigger over wiped the area with hotel toilet paper that was rough. She has had hemorrhoids before but this is worst flare. No prior surgery on them in past. Primary symptom is pain with swollen hemorrhoid, likely external hemorrhoid, significant pain with prolong sitting or with bowel movements. Only slight bleeding red blood on toilet paper with bowel movements Tried topical witch hazel, hemorrhoid cream OTC limited relief, tried ice packs / heat, unable to reduce the hemorrhoid Denies any recent constipation, abdominal pain, significant or persistent rectal bleeding or blood in stool   Depression screen [facility] 2/9 11/12/2018 04/29/2018 12/08/2017  Decreased Interest 0 1 0  Down, Depressed, Hopeless 0 2 0  PHQ - 2 Score 0 3 0  Altered sleeping 0 3 0  Tired, decreased energy 1 2 0  Change in appetite 0 2 0  Feeling bad or failure about yourself  0 2 0  Trouble concentrating 0 0 0  Moving slowly or fidgety/restless 0 0 0  Suicidal thoughts 0 0 0  PHQ-9 Score 1 12 0  Difficult doing work/chores Not difficult at all Somewhat difficult Not difficult at all    Social History   Tobacco Use  . Smoking status: Never Smoker  . Smokeless tobacco: Never Used  Vaping Use  . Vaping Use: Never used  Substance Use Topics  . Alcohol use: No    Alcohol/week: 0.0 standard drinks  . Drug use: No    Review of Systems Per HPI unless specifically indicated above     Objective:    BP 120/68   Pulse (!) 106   Temp 97.8 F (36.6 C) (Temporal)   Resp 16   Ht 5\' 3"  (1.6 m)   Wt 155 lb 9.6 oz (70.6 kg)   BMI  27.56 kg/m   Wt Readings from Last 3 Encounters:  07/11/19 155 lb 9.6 oz (70.6 kg)  11/12/18 150 lb (68 kg)  03/24/18 160 lb (72.6 kg)    Physical Exam Vitals and nursing note reviewed.  Constitutional:      General: She is not in acute distress.    Appearance: She is well-developed. She is not diaphoretic.     Comments: Well-appearing, comfortable, cooperative  HENT:     Head: Normocephalic and atraumatic.  Eyes:     General:        Right eye: No discharge.        Left eye: No discharge.     Conjunctiva/sclera: Conjunctivae normal.  Cardiovascular:     Rate and Rhythm: Normal rate.  Pulmonary:     Effort: Pulmonary effort is normal.  Genitourinary:    Comments: Rectal: External hemorrhoid inflamed and likely thrombosed swollen tender, without bleeding. Skin:    General: Skin is warm and dry.     Findings: No erythema or rash.  Neurological:     Mental Status: She is alert and oriented to person, place, and time.  Psychiatric:        Behavior: Behavior normal.     Comments: Well groomed, good eye contact, normal speech and thoughts  Results for orders placed or performed in visit on 11/05/18  VITAMIN D 25 Hydroxy (Vit-D Deficiency, Fractures)  Result Value Ref Range   Vit D, 25-Hydroxy 21 (L) 30 - 100 ng/mL  Lipid panel  Result Value Ref Range   Cholesterol 249 (H) <200 mg/dL   HDL 42 (L) > OR = 50 mg/dL   Triglycerides 151 (H) <150 mg/dL   LDL Cholesterol (Calc) 177 (H) mg/dL (calc)   Total CHOL/HDL Ratio 5.9 (H) <5.0 (calc)   Non-HDL Cholesterol (Calc) 207 (H) <130 mg/dL (calc)  COMPLETE METABOLIC PANEL WITH GFR  Result Value Ref Range   Glucose, Bld 108 (H) 65 - 99 mg/dL   BUN 15 7 - 25 mg/dL   Creat 0.85 0.50 - 1.05 mg/dL   GFR, Est Non African American 79 > OR = 60 mL/min/1.61m2   GFR, Est African American 92 > OR = 60 mL/min/1.28m2   BUN/Creatinine Ratio NOT APPLICABLE 6 - 22 (calc)   Sodium 139 135 - 146 mmol/L   Potassium 4.1 3.5 - 5.3 mmol/L    Chloride 105 98 - 110 mmol/L   CO2 28 20 - 32 mmol/L   Calcium 9.4 8.6 - 10.4 mg/dL   Total Protein 6.3 6.1 - 8.1 g/dL   Albumin 4.2 3.6 - 5.1 g/dL   Globulin 2.1 1.9 - 3.7 g/dL (calc)   AG Ratio 2.0 1.0 - 2.5 (calc)   Total Bilirubin 0.4 0.2 - 1.2 mg/dL   Alkaline phosphatase (APISO) 49 37 - 153 U/L   AST 10 10 - 35 U/L   ALT 9 6 - 29 U/L  CBC with Differential/Platelet  Result Value Ref Range   WBC 5.1 3.8 - 10.8 Thousand/uL   RBC 4.50 3.80 - 5.10 Million/uL   Hemoglobin 12.5 11.7 - 15.5 g/dL   HCT 38.0 35 - 45 %   MCV 84.4 80.0 - 100.0 fL   MCH 27.8 27.0 - 33.0 pg   MCHC 32.9 32.0 - 36.0 g/dL   RDW 14.4 11.0 - 15.0 %   Platelets 296 140 - 400 Thousand/uL   MPV 10.5 7.5 - 12.5 fL   Neutro Abs 3,621 1,500 - 7,800 cells/uL   Lymphs Abs 928 850 - 3,900 cells/uL   Absolute Monocytes 398 200 - 950 cells/uL   Eosinophils Absolute 112 15 - 500 cells/uL   Basophils Absolute 41 0 - 200 cells/uL   Neutrophils Relative % 71 %   Total Lymphocyte 18.2 %   Monocytes Relative 7.8 %   Eosinophils Relative 2.2 %   Basophils Relative 0.8 %  Hemoglobin A1c  Result Value Ref Range   Hgb A1c MFr Bld 5.6 <5.7 % of total Hgb   Mean Plasma Glucose 114 (calc)   eAG (mmol/L) 6.3 (calc)      Assessment & Plan:   Problem List Items Addressed This Visit    None    Visit Diagnoses    External hemorrhoid, thrombosed    -  Primary      Acute inflamed external hemorrhoid likely thrombosed on exam and by history - change within past 2 days approximately, but original onset within 1 week ago Small amount of bright red blood on toilet paper at times without diffuse bleeding No evidence of anal fissure or any perineal problems, no sign of erythema or cellulitis. Failed topical treatment for hemorrhoid prep H and witch hazel and topical sitz baths OTC  Plan:  Urgent Referral made to [place], apt  today with PA ASAP when patient arrives for hemorrhoid treatment  likely lance I&D thrombosed hemorrhoid  Continue topical treatments Start Sitz Baths or warm bathtub soaks to help resolve flare Avoid constipation and straining, recommend high fiber diet, improve hydration - may use Miralax or stool sofetner May take NSAID PRN Caution with prolonged sitting  No orders of the defined types were placed in this encounter.    Follow up plan: Return in about 1 week (around 07/18/2019), or if symptoms worsen or fail to improve, for hemorrhoid.    [name], [facility] 07/11/2019, 2:47 PM

## 2019-07-11 NOTE — Patient Instructions (Addendum)
Thank you for coming to the office today.  You have an inflamed external hemorrhoid, which involves swollen veins on your rectum, it is very sensitive and causes your severe pain. You may experience worsening pain and bleeding with bright red blood if the hemorrhoid develops a superficial blood clot. Also you may have deeper internal hemorrhoids that can cause bleeding without as much pain.  Called and scheduled you with General Surgery for procedure today  [place] To see Surgical PA - Puja Duchesne, [place]: [place]  Appointment immediately upon arrival  - continue the warm bathtub soak 1-2 times daily for next week if you can, or can try the [facility] for just your bottom - Try to stay well hydrated, avoid constipation and straining, eat a high fiber diet  You can keep using topical hemorrhoid cream as needed.   Please schedule a Follow-up Appointment to: Return in about 1 week (around 07/18/2019), or if symptoms worsen or fail to improve, for hemorrhoid.  If you have any other questions or concerns, please feel free to call the office or send a message through [place]. You may also schedule an earlier appointment if necessary.  Additionally, you may be receiving a survey about your experience at our office within a few days to 1 week by e-mail or mail. We value your feedback.  [name], DO [place]

## 2019-07-12 ENCOUNTER — Ambulatory Visit: Payer: Self-pay | Admitting: Surgery

## 2019-07-19 ENCOUNTER — Other Ambulatory Visit: Payer: Self-pay | Admitting: Family Medicine

## 2019-07-19 DIAGNOSIS — K219 Gastro-esophageal reflux disease without esophagitis: Secondary | ICD-10-CM

## 2019-07-19 NOTE — Telephone Encounter (Signed)
Requested Prescriptions  Pending Prescriptions Disp Refills  . pantoprazole (PROTONIX) 40 MG tablet [Pharmacy Med Name: Pantoprazole Sodium 40 MG Oral Tablet Delayed Release] 90 tablet 3    Sig: TAKE 1 TABLET BY MOUTH ONCE DAILY BEFORE BREAKFAST     Gastroenterology: Proton Pump Inhibitors Passed - 07/19/2019  2:30 AM      Passed - Valid encounter within last 12 months    Recent Outpatient Visits          1 week ago External hemorrhoid, thrombosed   [place], DO   8 months ago Annual physical exam   [place], DO   1 year ago Restless leg syndrome   [place], DO   1 year ago Restless leg syndrome   [facility] [place], Devonne Doughty, [place]   2 years ago Restless leg syndrome   [facility] [place], Devonne Doughty, [place]

## 2019-08-29 ENCOUNTER — Other Ambulatory Visit: Payer: Self-pay | Admitting: Family Medicine

## 2019-08-29 DIAGNOSIS — F411 Generalized anxiety disorder: Secondary | ICD-10-CM

## 2019-08-29 DIAGNOSIS — F5104 Psychophysiologic insomnia: Secondary | ICD-10-CM

## 2019-08-29 MED ORDER — HYDROXYZINE HCL 25 MG PO TABS: 25.0000 mg | ORAL_TABLET | Freq: Every evening | ORAL | 2 refills | Status: DC | PRN

## 2019-08-29 NOTE — Telephone Encounter (Signed)
Medication Refill - Medication: hydrOXYzine (ATARAX/VISTARIL) 25 MG tablet   Preferred Pharmacy (with phone number or street name):  [facility], [place] Phone:  [phone]  Fax:  [phone]       Agent: Please be advised that RX refills may take up to 3 business days. We ask that you follow-up with your pharmacy.

## 2019-10-23 ENCOUNTER — Other Ambulatory Visit: Payer: Self-pay | Admitting: Family Medicine

## 2019-10-23 DIAGNOSIS — F428 Other obsessive-compulsive disorder: Secondary | ICD-10-CM

## 2019-10-23 NOTE — Telephone Encounter (Signed)
Requested Prescriptions  Pending Prescriptions Disp Refills  . citalopram (CELEXA) 40 MG tablet [Pharmacy Med Name: Citalopram Hydrobromide 40 MG Oral Tablet] 90 tablet 0    Sig: Take 1 tablet by mouth once daily     Psychiatry:  Antidepressants - SSRI Passed - 10/23/2019 10:30 AM      Passed - Completed PHQ-2 or PHQ-9 in the last 360 days.      Passed - Valid encounter within last 6 months    Recent Outpatient Visits          3 months ago External hemorrhoid, thrombosed   [place], [place]   11 months ago Annual physical exam   [place], DO   1 year ago Restless leg syndrome   [place], DO   1 year ago Restless leg syndrome   [facility] [place], Devonne Doughty, [place]   2 years ago Restless leg syndrome   [facility] [place], Devonne Doughty, [place]

## 2019-12-02 ENCOUNTER — Inpatient Hospital Stay: Payer: 59

## 2019-12-02 ENCOUNTER — Inpatient Hospital Stay: Payer: 59 | Attending: Oncology | Admitting: Oncology

## 2019-12-02 ENCOUNTER — Encounter: Payer: Self-pay | Admitting: Oncology

## 2019-12-02 VITALS — BP 119/66 | HR 85 | Temp 98.2°F | Wt 151.2 lb

## 2019-12-02 DIAGNOSIS — D509 Iron deficiency anemia, unspecified: Secondary | ICD-10-CM | POA: Insufficient documentation

## 2019-12-02 DIAGNOSIS — N921 Excessive and frequent menstruation with irregular cycle: Secondary | ICD-10-CM | POA: Diagnosis not present

## 2019-12-02 DIAGNOSIS — Z8042 Family history of malignant neoplasm of prostate: Secondary | ICD-10-CM | POA: Diagnosis not present

## 2019-12-02 DIAGNOSIS — Z79899 Other long term (current) drug therapy: Secondary | ICD-10-CM | POA: Diagnosis not present

## 2019-12-02 LAB — IRON AND TIBC
Iron: 36 ug/dL (ref 28–170)
Saturation Ratios: 10 % — ABNORMAL LOW (ref 10.4–31.8)
TIBC: 377 ug/dL (ref 250–450)
UIBC: 341 ug/dL

## 2019-12-02 LAB — COMPREHENSIVE METABOLIC PANEL
ALT: 13 U/L (ref 0–44)
AST: 12 U/L — ABNORMAL LOW (ref 15–41)
Albumin: 4.2 g/dL (ref 3.5–5.0)
Alkaline Phosphatase: 42 U/L (ref 38–126)
Anion gap: 6 (ref 5–15)
BUN: 10 mg/dL (ref 6–20)
CO2: 27 mmol/L (ref 22–32)
Calcium: 9 mg/dL (ref 8.9–10.3)
Chloride: 104 mmol/L (ref 98–111)
Creatinine, Ser: 0.7 mg/dL (ref 0.44–1.00)
GFR, Estimated: >60 mL/min (ref >60)
Glucose, Bld: 111 mg/dL — ABNORMAL HIGH (ref 70–99)
Potassium: 4.3 mmol/L (ref 3.5–5.1)
Sodium: 137 mmol/L (ref 135–145)
Total Bilirubin: 0.5 mg/dL (ref 0.3–1.2)
Total Protein: 7.1 g/dL (ref 6.5–8.1)

## 2019-12-02 LAB — CBC WITH DIFFERENTIAL/PLATELET
Abs Immature Granulocytes: 0.02 10*3/uL (ref 0.00–0.07)
Basophils Absolute: 0 10*3/uL (ref 0.0–0.1)
Basophils Relative: 1 %
Eosinophils Absolute: 0.1 10*3/uL (ref 0.0–0.5)
Eosinophils Relative: 2 %
HCT: 38.5 % (ref 36.0–46.0)
Hemoglobin: 12.7 g/dL (ref 12.0–15.0)
Immature Granulocytes: 0 %
Lymphocytes Relative: 18 %
Lymphs Abs: 1.1 10*3/uL (ref 0.7–4.0)
MCH: 28.3 pg (ref 26.0–34.0)
MCHC: 33 g/dL (ref 30.0–36.0)
MCV: 85.7 fL (ref 80.0–100.0)
Monocytes Absolute: 0.4 10*3/uL (ref 0.1–1.0)
Monocytes Relative: 7 %
Neutro Abs: 4.4 10*3/uL (ref 1.7–7.7)
Neutrophils Relative %: 72 %
Platelets: 248 10*3/uL (ref 150–400)
RBC: 4.49 MIL/uL (ref 3.87–5.11)
RDW: 14 % (ref 11.5–15.5)
WBC: 6.1 10*3/uL (ref 4.0–10.5)
nRBC: 0 % (ref 0.0–0.2)

## 2019-12-02 LAB — FERRITIN: Ferritin: 6 ng/mL — ABNORMAL LOW (ref 11–307)

## 2019-12-02 LAB — TSH: TSH: 0.723 u[IU]/mL (ref 0.350–4.500)

## 2019-12-02 MED ORDER — FERROUS SULFATE 325 (65 FE) MG PO TBEC: 325.0000 mg | DELAYED_RELEASE_TABLET | Freq: Two times a day (BID) | ORAL | 2 refills | Status: DC

## 2019-12-02 NOTE — Addendum Note (Signed)
Addended by: Earlie Server on: 12/02/2019 08:25 PM   Modules accepted: Orders

## 2019-12-02 NOTE — Progress Notes (Signed)
Hematology/Oncology Consult note [facility] [phone] [phone]   Patient Care Team: Olin Hauser, DO as PCP - General (Family Medicine)  REFERRING PROVIDER: Jannifer Franklin, NP CHIEF COMPLAINTS/REASON FOR VISIT:  Evaluation of iron deficiency  HISTORY OF PRESENTING ILLNESS:  Gloria Harrison is a  54 y.o.  female with PMH listed below was seen in consultation at the request of Jannifer Franklin, NP   for evaluation of iron deficiency   Reviewed patient's recent labs  Patient recently was seen by neurology for restless leg syndrome. Has part of the work-up, ferritin was obtained and level was found to be severely decreased at 9. No CBC was obtained recently Patient had a CBC done approximately 1 year ago with normal hemoglobin .  Associated signs and symptoms: Patient reports fatigue.  Denies SOB with exertion.  Denies weight loss, easy bruising, hematochezia, hemoptysis, hematuria. Context:  History of iron deficiency: Denies Rectal bleeding: Denies Menstrual bleeding/ Vaginal bleeding : Heavy menses recently.  Perimenopausal symptoms. Hematemesis or hemoptysis : denies Blood in urine : denies  Last colonoscopy was in 2020    Review of Systems  Constitutional: Positive for fatigue. Negative for appetite change, chills and fever.  HENT:   Negative for hearing loss and voice change.   Eyes: Negative for eye problems.  Respiratory: Negative for chest tightness and cough.   Cardiovascular: Negative for chest pain.  Gastrointestinal: Negative for abdominal distention, abdominal pain and blood in stool.  Endocrine: Negative for hot flashes.  Genitourinary: Negative for difficulty urinating and frequency.   Musculoskeletal: Negative for arthralgias.  Skin: Negative for itching and rash.  Neurological: Negative for extremity weakness.  Hematological: Negative for adenopathy.  Psychiatric/Behavioral: Negative for  confusion.    MEDICAL HISTORY:  Past Medical History:  Diagnosis Date  . Chronic kidney disease   . GERD (gastroesophageal reflux disease)   . OCD (obsessive compulsive disorder)     SURGICAL HISTORY: Past Surgical History:  Procedure Laterality Date  . CESAREAN SECTION  1996  . CESAREAN SECTION  2001  . COLONOSCOPY WITH PROPOFOL N/A 03/24/2018   Procedure: COLONOSCOPY WITH PROPOFOL;  Surgeon: Virgel Manifold, MD;  Location: [facility] ENDOSCOPY;  Service: Endoscopy;  Laterality: N/A;  . COSMETIC SURGERY  2002   Tummy tuck  . ESOPHAGOGASTRODUODENOSCOPY (EGD) WITH PROPOFOL N/A 03/24/2018   Procedure: ESOPHAGOGASTRODUODENOSCOPY (EGD) WITH PROPOFOL;  Surgeon: Virgel Manifold, MD;  Location: [facility] ENDOSCOPY;  Service: Endoscopy;  Laterality: N/A;  . LITHOTRIPSY  2006 & 2008  . TONSILLECTOMY  1990    SOCIAL HISTORY: Social History   Socioeconomic History  . Marital status: Married    Spouse name: Not on file  . Number of children: Not on file  . Years of education: Not on file  . Highest education level: Not on file  Occupational History  . Occupation: Special educational needs teacher  . Occupation: Administrator, arts   Tobacco Use  . Smoking status: Never Smoker  . Smokeless tobacco: Never Used  Vaping Use  . Vaping Use: Never used  Substance and Sexual Activity  . Alcohol use: No    Alcohol/week: 0.0 standard drinks  . Drug use: No  . Sexual activity: Yes    Partners: Male    Birth control/protection: Other-see comments    Comment: Husband had a Vasectomy.  Other Topics Concern  . Not on file  Social History Narrative  . Not on file   Social Determinants of Health  Financial Resource Strain:   . Difficulty of Paying Living Expenses: Not on file  Food Insecurity:   . Worried About Charity fundraiser in the Last Year: Not on file  . Ran Out of Food in the Last Year: Not on file  Transportation Needs:   . Lack of Transportation (Medical): Not on file  . Lack  of Transportation (Non-Medical): Not on file  Physical Activity:   . Days of Exercise per Week: Not on file  . Minutes of Exercise per Session: Not on file  Stress:   . Feeling of Stress : Not on file  Social Connections:   . Frequency of Communication with Friends and Family: Not on file  . Frequency of Social Gatherings with Friends and Family: Not on file  . Attends Religious Services: Not on file  . Active Member of Clubs or Organizations: Not on file  . Attends Archivist Meetings: Not on file  . Marital Status: Not on file  Intimate Partner Violence:   . Fear of Current or Ex-Partner: Not on file  . Emotionally Abused: Not on file  . Physically Abused: Not on file  . Sexually Abused: Not on file    FAMILY HISTORY: Family History  Problem Relation Age of Onset  . Kidney Stones Mother   . Hyperlipidemia Mother   . Hyperlipidemia Father   . Hypertension Father   . Cancer Father        Jaw tumor  . Asthma Son   . Suicidality Son   . Depression Son   . Hypothyroidism Sister   . Prostate cancer Maternal Uncle   . Diabetes Maternal Uncle   . Colon cancer Neg Hx     ALLERGIES:  is allergic to ciprofloxacin and ropinirole.  MEDICATIONS:  Current Outpatient Medications  Medication Sig Dispense Refill  . citalopram (CELEXA) 40 MG tablet Take 1 tablet by mouth once daily 90 tablet 0  . pantoprazole (PROTONIX) 40 MG tablet TAKE 1 TABLET BY MOUTH ONCE DAILY BEFORE BREAKFAST 90 tablet 3  . pregabalin (LYRICA) 50 MG capsule Take 50 mg by mouth at bedtime.    Marland Kitchen XIIDRA 5 % SOLN      No current facility-administered medications for this visit.     PHYSICAL EXAMINATION: ECOG PERFORMANCE STATUS: 0 - Asymptomatic Vitals:   12/02/19 1106  BP: 119/66  Pulse: 85  Temp: 98.2 F (36.8 C)  SpO2: 99%   Filed Weights   12/02/19 1106  Weight: 151 lb 3.2 oz (68.6 kg)    Physical Exam Constitutional:      General: She is not in acute distress. HENT:     Head:  Normocephalic and atraumatic.  Eyes:     General: No scleral icterus. Cardiovascular:     Rate and Rhythm: Normal rate and regular rhythm.     Heart sounds: Normal heart sounds.  Pulmonary:     Effort: Pulmonary effort is normal. No respiratory distress.     Breath sounds: No wheezing.  Abdominal:     General: Bowel sounds are normal. There is no distension.     Palpations: Abdomen is soft.  Musculoskeletal:        General: No deformity. Normal range of motion.     Cervical back: Normal range of motion and neck supple.  Skin:    General: Skin is warm and dry.     Findings: No erythema or rash.  Neurological:     Mental Status: She is alert and oriented  to person, place, and time. Mental status is at baseline.     Cranial Nerves: No cranial nerve deficit.     Coordination: Coordination normal.  Psychiatric:        Mood and Affect: Mood normal.       CMP Latest Ref Rng & Units 12/02/2019  Glucose 70 - 99 mg/dL 111(H)  BUN 6 - 20 mg/dL 10  Creatinine 0.44 - 1.00 mg/dL 0.70  Sodium 135 - 145 mmol/L 137  Potassium 3.5 - 5.1 mmol/L 4.3  Chloride 98 - 111 mmol/L 104  CO2 22 - 32 mmol/L 27  Calcium 8.9 - 10.3 mg/dL 9.0  Total Protein 6.5 - 8.1 g/dL 7.1  Total Bilirubin 0.3 - 1.2 mg/dL 0.5  Alkaline Phos 38 - 126 U/L 42  AST 15 - 41 U/L 12(L)  ALT 0 - 44 U/L 13   CBC Latest Ref Rng & Units 12/02/2019  WBC 4.0 - 10.5 K/uL 6.1  Hemoglobin 12.0 - 15.0 g/dL 12.7  Hematocrit 36 - 46 % 38.5  Platelets 150 - 400 K/uL 248     LABORATORY DATA:  I have reviewed the data as listed Lab Results  Component Value Date   WBC 6.1 12/02/2019   HGB 12.7 12/02/2019   HCT 38.5 12/02/2019   MCV 85.7 12/02/2019   PLT 248 12/02/2019   Recent Labs    12/02/19 1149  NA 137  K 4.3  CL 104  CO2 27  GLUCOSE 111*  BUN 10  CREATININE 0.70  CALCIUM 9.0  GFRNONAA >60  PROT 7.1  ALBUMIN 4.2  AST 12*  ALT 13  ALKPHOS 42  BILITOT 0.5   Iron/TIBC/Ferritin/ %Sat    Component Value  Date/Time   IRON 36 12/02/2019 1149   TIBC 377 12/02/2019 1149   FERRITIN 6 (L) 12/02/2019 1149   IRONPCTSAT 10 (L) 12/02/2019 1149     RADIOGRAPHIC STUDIES: I have personally reviewed the radiological images as listed and agreed with the findings in the report. No results found.     ASSESSMENT & PLAN:  1. Iron deficiency anemia, unspecified iron deficiency anemia type   2. Menorrhagia with irregular cycle    Iron deficiency Recommend to check iron, TIBC, ferritin, CBC.  CMP Discussed about oral iron supplementation versus IV Venofer treatments. Labs are reviewed.  Patient has hemoglobin 12.7, ferritin 6 iron saturation 10. I recommend patient to start with oral iron supplementation with ferrous sulfate 325 mg twice daily with meals. She may use stool softener if constipated.   Etiology of iron deficiency likely secondary to heavy menses. Check TSH Patient had a colonoscopy last year.   Orders Placed This Encounter  Procedures  . CBC with Differential/Platelet    Standing Status:   Future    Number of Occurrences:   1    Standing Expiration Date:   12/01/2020  . Comprehensive metabolic panel    Standing Status:   Future    Number of Occurrences:   1    Standing Expiration Date:   12/01/2020  . TSH    Standing Status:   Future    Number of Occurrences:   1    Standing Expiration Date:   12/01/2020  . Ferritin    Standing Status:   Future    Number of Occurrences:   1    Standing Expiration Date:   12/01/2020  . Iron and TIBC    Standing Status:   Future    Number of Occurrences:  1    Standing Expiration Date:   12/01/2020    All questions were answered. The patient knows to call the clinic with any problems questions or concerns.  Cc Jannifer Franklin, NP  Return of visit: 3 months Thank you for this kind referral and the opportunity to participate in the care of this patient. A copy of today's note is routed to referring provider   Earlie Server, MD, PhD Hematology  Oncology [place] at [facility] 12/02/2019

## 2019-12-02 NOTE — Progress Notes (Signed)
Pt here to establish care for iron deficiency

## 2019-12-05 ENCOUNTER — Telehealth: Payer: Self-pay

## 2019-12-05 NOTE — Telephone Encounter (Signed)
-----   Message from Earlie Server, MD sent at 12/02/2019  8:23 PM EDT ----- Please let patient know that her iron is low but no anemia.  I recommend patient to start with oral iron supplementation ferrous sulfate 325 mg twice daily with meals.  If she gets constipated, she may take over-the-counter stool softeners.  Please arrange her to follow-up in 3 months lab prior MD.  Letter has been ordered. She says she cannot log into her MyChart.  Please give her a call.

## 2019-12-05 NOTE — Telephone Encounter (Signed)
Done Appt for labs in 48mths  MyChart Visit 2 days after labs per pt request has been sched. Pt is aware.

## 2019-12-05 NOTE — Telephone Encounter (Signed)
Patient notified and voiced understanding.

## 2020-01-28 ENCOUNTER — Other Ambulatory Visit: Payer: Self-pay | Admitting: Family Medicine

## 2020-01-28 DIAGNOSIS — F428 Other obsessive-compulsive disorder: Secondary | ICD-10-CM

## 2020-01-30 NOTE — Telephone Encounter (Signed)
Requested medication (s) are due for refill today: yes  Requested medication (s) are on the active medication list: yes  Last refill:  10/23/19 #90 0 refills  Future visit scheduled: no  Notes to clinic:  patient requesting to set up OV after checking her work schedule and after seeing oncologist in Feb. Patient contacted and will call office to set up future appt. Do you want to refill Rx? Patient only has 2 weeks left on Rx .     Requested Prescriptions  Pending Prescriptions Disp Refills   citalopram (CELEXA) 40 MG tablet [Pharmacy Med Name: Citalopram Hydrobromide 40 MG Oral Tablet] 90 tablet 0    Sig: Take 1 tablet by mouth once daily      Psychiatry:  Antidepressants - SSRI Failed - 01/28/2020  5:58 PM      Failed - Completed PHQ-2 or PHQ-9 in the last 360 days      Failed - Valid encounter within last 6 months    Recent Outpatient Visits           6 months ago External hemorrhoid, thrombosed   [facility] Al[place] Charon, Netta Neat, DO   1 year ago Annual physical exam   [facility] Smitty Cords, DO   1 year ago Restless leg syndrome   [facility] [place], Netta Neat, DO   2 years ago Restless leg syndrome   [facility] [place], Netta Neat, DO   2 years ago Restless leg syndrome   [facility] [place], Netta Neat, DO       Future Appointments             In 1 month Rickard Patience, MD [facility]

## 2020-03-07 ENCOUNTER — Inpatient Hospital Stay: Payer: 59 | Attending: Oncology

## 2020-03-07 DIAGNOSIS — D509 Iron deficiency anemia, unspecified: Secondary | ICD-10-CM | POA: Diagnosis not present

## 2020-03-07 LAB — CBC WITH DIFFERENTIAL/PLATELET
Abs Immature Granulocytes: 0.04 10*3/uL (ref 0.00–0.07)
Basophils Absolute: 0.1 10*3/uL (ref 0.0–0.1)
Basophils Relative: 1 %
Eosinophils Absolute: 0.1 10*3/uL (ref 0.0–0.5)
Eosinophils Relative: 1 %
HCT: 39.6 % (ref 36.0–46.0)
Hemoglobin: 13.6 g/dL (ref 12.0–15.0)
Immature Granulocytes: 0 %
Lymphocytes Relative: 17 %
Lymphs Abs: 1.5 10*3/uL (ref 0.7–4.0)
MCH: 29.9 pg (ref 26.0–34.0)
MCHC: 34.3 g/dL (ref 30.0–36.0)
MCV: 87 fL (ref 80.0–100.0)
Monocytes Absolute: 0.6 10*3/uL (ref 0.1–1.0)
Monocytes Relative: 7 %
Neutro Abs: 6.7 10*3/uL (ref 1.7–7.7)
Neutrophils Relative %: 74 %
Platelets: 276 10*3/uL (ref 150–400)
RBC: 4.55 MIL/uL (ref 3.87–5.11)
RDW: 13.5 % (ref 11.5–15.5)
WBC: 9 10*3/uL (ref 4.0–10.5)
nRBC: 0 % (ref 0.0–0.2)

## 2020-03-07 LAB — IRON AND TIBC
Iron: 115 ug/dL (ref 28–170)
Saturation Ratios: 35 % — ABNORMAL HIGH (ref 10.4–31.8)
TIBC: 326 ug/dL (ref 250–450)
UIBC: 211 ug/dL

## 2020-03-07 LAB — FERRITIN: Ferritin: 25 ng/mL (ref 11–307)

## 2020-03-09 ENCOUNTER — Encounter: Payer: Self-pay | Admitting: Oncology

## 2020-03-09 ENCOUNTER — Inpatient Hospital Stay (HOSPITAL_BASED_OUTPATIENT_CLINIC_OR_DEPARTMENT_OTHER): Payer: 59 | Admitting: Oncology

## 2020-03-09 DIAGNOSIS — N921 Excessive and frequent menstruation with irregular cycle: Secondary | ICD-10-CM

## 2020-03-09 DIAGNOSIS — D509 Iron deficiency anemia, unspecified: Secondary | ICD-10-CM

## 2020-03-09 NOTE — Progress Notes (Signed)
Patient denies new problems/concerns today.   °

## 2020-03-09 NOTE — Progress Notes (Signed)
HEMATOLOGY-ONCOLOGY TeleHEALTH VISIT PROGRESS NOTE  I connected with Gloria Harrison on 03/09/20  at  2:45 PM EST by video enabled telemedicine visit and verified that I am speaking with the correct person using two identifiers. I discussed the limitations, risks, security and privacy concerns of performing an evaluation and management service by telemedicine and the availability of in-person appointments. The patient expressed understanding and agreed to proceed.   Other persons participating in the visit and their role in the encounter:  None  Patient's location: She is at work Provider's location: office Chief Complaint: Iron deficiency   Gloria Harrison is a 55 y.o. female who has above history reviewed by me today presents for follow up visit for management of iron deficiency Problems and complaints are listed below:  Patient takes oral iron supplement presentation.  Tolerates well.  Presents virtually to discuss results and further management plan. No new complaints.  Restless leg syndrome has improved.  Review of Systems  Constitutional: Negative for appetite change, chills, fatigue and fever.  HENT:   Negative for hearing loss and voice change.   Eyes: Negative for eye problems.  Respiratory: Negative for chest tightness and cough.   Cardiovascular: Negative for chest pain.  Gastrointestinal: Negative for abdominal distention, abdominal pain and blood in stool.  Endocrine: Negative for hot flashes.  Genitourinary: Negative for difficulty urinating and frequency.   Musculoskeletal: Negative for arthralgias.  Skin: Negative for itching and rash.  Neurological: Negative for extremity weakness.  Hematological: Negative for adenopathy.  Psychiatric/Behavioral: Negative for confusion.    Past Medical History:  Diagnosis Date  . Chronic kidney disease   . GERD (gastroesophageal reflux disease)   . OCD (obsessive compulsive disorder)    Past Surgical History:   Procedure Laterality Date  . CESAREAN SECTION  1996  . CESAREAN SECTION  2001  . COLONOSCOPY WITH PROPOFOL N/A 03/24/2018   Procedure: COLONOSCOPY WITH PROPOFOL;  Surgeon: Virgel Manifold, MD;  Location: [facility] ENDOSCOPY;  Service: Endoscopy;  Laterality: N/A;  . COSMETIC SURGERY  2002   Tummy tuck  . ESOPHAGOGASTRODUODENOSCOPY (EGD) WITH PROPOFOL N/A 03/24/2018   Procedure: ESOPHAGOGASTRODUODENOSCOPY (EGD) WITH PROPOFOL;  Surgeon: Virgel Manifold, MD;  Location: [facility] ENDOSCOPY;  Service: Endoscopy;  Laterality: N/A;  . LITHOTRIPSY  2006 & 2008  . TONSILLECTOMY  1990    Family History  Problem Relation Age of Onset  . Kidney Stones Mother   . Hyperlipidemia Mother   . Hyperlipidemia Father   . Hypertension Father   . Cancer Father        Jaw tumor  . Asthma Son   . Suicidality Son   . Depression Son   . Hypothyroidism Sister   . Prostate cancer Maternal Uncle   . Diabetes Maternal Uncle   . Colon cancer Neg Hx     Social History   Socioeconomic History  . Marital status: Married    Spouse name: Not on file  . Number of children: Not on file  . Years of education: Not on file  . Highest education level: Not on file  Occupational History  . Occupation: Special educational needs teacher  . Occupation: Administrator, arts   Tobacco Use  . Smoking status: Never Smoker  . Smokeless tobacco: Never Used  Vaping Use  . Vaping Use: Never used  Substance and Sexual Activity  . Alcohol use: No    Alcohol/week: 0.0 standard drinks  . Drug use: No  . Sexual activity: Yes  Partners: Male    Birth control/protection: Other-see comments    Comment: Husband had a Vasectomy.  Other Topics Concern  . Not on file  Social History Narrative  . Not on file   Social Determinants of Health   Financial Resource Strain: Not on file  Food Insecurity: Not on file  Transportation Needs: Not on file  Physical Activity: Not on file  Stress: Not on file  Social Connections: Not on  file  Intimate Partner Violence: Not on file    Current Outpatient Medications on File Prior to Visit  Medication Sig Dispense Refill  . citalopram (CELEXA) 40 MG tablet Take 1 tablet by mouth once daily 90 tablet 0  . ferrous sulfate 325 (65 FE) MG EC tablet Take 1 tablet (325 mg total) by mouth 2 (two) times daily with a meal. 60 tablet 2  . pantoprazole (PROTONIX) 40 MG tablet TAKE 1 TABLET BY MOUTH ONCE DAILY BEFORE BREAKFAST 90 tablet 3  . pregabalin (LYRICA) 50 MG capsule Take 50 mg by mouth at bedtime.    Marland Kitchen XIIDRA 5 % SOLN      No current facility-administered medications on file prior to visit.    Allergies  Allergen Reactions  . Ciprofloxacin Hives  . Ropinirole Nausea Only       Observations/Objective: Today's Vitals   03/09/20 1426  PainSc: 0-No pain   There is no height or weight on file to calculate BMI.  Physical Exam Neurological:     Mental Status: She is alert.     CBC    Component Value Date/Time   WBC 9.0 03/07/2020 1535   RBC 4.55 03/07/2020 1535   HGB 13.6 03/07/2020 1535   HGB 13.1 08/09/2014 1653   HCT 39.6 03/07/2020 1535   HCT 39.9 08/09/2014 1653   PLT 276 03/07/2020 1535   PLT 304 08/09/2014 1653   MCV 87.0 03/07/2020 1535   MCV 87 08/09/2014 1653   MCV 88 12/31/2011 0124   MCH 29.9 03/07/2020 1535   MCHC 34.3 03/07/2020 1535   RDW 13.5 03/07/2020 1535   RDW 14.2 08/09/2014 1653   RDW 13.4 12/31/2011 0124   LYMPHSABS 1.5 03/07/2020 1535   LYMPHSABS 1.8 08/09/2014 1653   LYMPHSABS 1.2 12/31/2011 0124   MONOABS 0.6 03/07/2020 1535   MONOABS 0.8 12/31/2011 0124   EOSABS 0.1 03/07/2020 1535   EOSABS 0.1 08/09/2014 1653   EOSABS 0.1 12/31/2011 0124   BASOSABS 0.1 03/07/2020 1535   BASOSABS 0.0 08/09/2014 1653   BASOSABS 0.0 12/31/2011 0124    CMP     Component Value Date/Time   NA 137 12/02/2019 1149   NA 141 08/09/2014 1653   NA 141 12/31/2011 0124   K 4.3 12/02/2019 1149   K 3.5 12/31/2011 0124   CL 104 12/02/2019 1149    CL 109 (H) 12/31/2011 0124   CO2 27 12/02/2019 1149   CO2 24 12/31/2011 0124   GLUCOSE 111 (H) 12/02/2019 1149   GLUCOSE 131 (H) 12/31/2011 0124   BUN 10 12/02/2019 1149   BUN 11 08/09/2014 1653   BUN 16 12/31/2011 0124   CREATININE 0.70 12/02/2019 1149   CREATININE 0.85 11/05/2018 0844   CALCIUM 9.0 12/02/2019 1149   CALCIUM 9.1 12/31/2011 0124   PROT 7.1 12/02/2019 1149   PROT 6.9 08/09/2014 1653   PROT 6.8 12/31/2011 0124   ALBUMIN 4.2 12/02/2019 1149   ALBUMIN 4.4 08/09/2014 1653   ALBUMIN 3.8 12/31/2011 0124   AST 12 (L) 12/02/2019 1149  AST 19 12/31/2011 0124   ALT 13 12/02/2019 1149   ALT 20 12/31/2011 0124   ALKPHOS 42 12/02/2019 1149   ALKPHOS 68 12/31/2011 0124   BILITOT 0.5 12/02/2019 1149   BILITOT 0.3 08/09/2014 1653   BILITOT 0.4 12/31/2011 0124   GFRNONAA >60 12/02/2019 1149   GFRNONAA 79 11/05/2018 0844   GFRAA 92 11/05/2018 0844     Assessment and Plan: 1. Iron deficiency anemia, unspecified iron deficiency anemia type   2. Menorrhagia with irregular cycle     Labs are reviewed and are discussed with patient. Hemoglobin is stable and normal. Iron panel has improved. Slightly increased iron saturation. Recommend patient to take oral iron supplementation during her week of menses. She prefers to follow-up with primary care provider for monitoring iron level.  She will be discharged from our clinic.  I discussed the assessment and treatment plan with the patient. The patient was provided an opportunity to ask questions and all were answered. The patient agreed with the plan and demonstrated an understanding of the instructions.  The patient was advised to call back or seek an in-person evaluation if the symptoms worsen or if the condition fails to improve as anticipated.    Earlie Server, MD 03/09/2020 9:37 PM

## 2020-05-21 ENCOUNTER — Other Ambulatory Visit: Payer: Self-pay | Admitting: Family Medicine

## 2020-05-21 DIAGNOSIS — F428 Other obsessive-compulsive disorder: Secondary | ICD-10-CM

## 2020-05-21 NOTE — Telephone Encounter (Signed)
Requested medications are due for refill today.  yes  Requested medications are on the active medications list.  yes  Last refill. 01/30/2020  Future visit scheduled.  no  Notes to clinic.  More than 3 months overdue for OV

## 2020-05-22 ENCOUNTER — Other Ambulatory Visit: Payer: Self-pay | Admitting: Family Medicine

## 2020-05-22 DIAGNOSIS — Z1231 Encounter for screening mammogram for malignant neoplasm of breast: Secondary | ICD-10-CM

## 2020-07-03 ENCOUNTER — Other Ambulatory Visit: Payer: Self-pay | Admitting: Family Medicine

## 2020-07-03 DIAGNOSIS — F428 Other obsessive-compulsive disorder: Secondary | ICD-10-CM

## 2020-07-03 MED ORDER — CITALOPRAM HYDROBROMIDE 40 MG PO TABS: 40.0000 mg | ORAL_TABLET | Freq: Every day | ORAL | 0 refills | Status: DC

## 2020-07-03 NOTE — Telephone Encounter (Signed)
  Notes to clinic: Patient has appt scheduled for 08/10/2020 Review for courtesy refill until that time   Requested Prescriptions  Pending Prescriptions Disp Refills   citalopram (CELEXA) 40 MG tablet 30 tablet 0    Sig: Take 1 tablet (40 mg total) by mouth daily. Please schedule office visit for further refills      Psychiatry:  Antidepressants - SSRI Failed - 07/03/2020  8:48 AM      Failed - Completed PHQ-2 or PHQ-9 in the last 360 days      Failed - Valid encounter within last 6 months    Recent Outpatient Visits           11 months ago External hemorrhoid, thrombosed   [place], DO   1 year ago Annual physical exam   [place], DO   2 years ago Restless leg syndrome   [place], DO   2 years ago Restless leg syndrome   [place], DO   3 years ago Restless leg syndrome   [facility] Olin Hauser, DO       Future Appointments             In 1 month [place], [facility], [facility]

## 2020-07-03 NOTE — Telephone Encounter (Signed)
Medication Refill - Medication: citalopram (CELEXA) 40 MG tablet   Has the patient contacted their pharmacy? No , pt is requesting a courtesy refill due to not having insurance at the moment, pt has appt scheduled for 08/10/20  (Preferred Pharmacy (with phone number or street name): [facility] [place], [place] Phone:  [phone]

## 2020-07-27 ENCOUNTER — Encounter: Payer: Self-pay | Admitting: Obstetrics and Gynecology

## 2020-08-10 ENCOUNTER — Ambulatory Visit: Payer: 59 | Admitting: Family Medicine

## 2020-08-10 ENCOUNTER — Encounter: Payer: Self-pay | Admitting: Family Medicine

## 2020-08-10 ENCOUNTER — Other Ambulatory Visit: Payer: Self-pay

## 2020-08-10 VITALS — BP 101/60 | HR 94 | Ht 62.0 in | Wt 148.6 lb

## 2020-08-10 DIAGNOSIS — N951 Menopausal and female climacteric states: Secondary | ICD-10-CM

## 2020-08-10 DIAGNOSIS — F5104 Psychophysiologic insomnia: Secondary | ICD-10-CM

## 2020-08-10 DIAGNOSIS — G2581 Restless legs syndrome: Secondary | ICD-10-CM | POA: Diagnosis not present

## 2020-08-10 DIAGNOSIS — R232 Flushing: Secondary | ICD-10-CM | POA: Diagnosis not present

## 2020-08-10 DIAGNOSIS — F331 Major depressive disorder, recurrent, moderate: Secondary | ICD-10-CM | POA: Diagnosis not present

## 2020-08-10 DIAGNOSIS — F428 Other obsessive-compulsive disorder: Secondary | ICD-10-CM

## 2020-08-10 DIAGNOSIS — F411 Generalized anxiety disorder: Secondary | ICD-10-CM

## 2020-08-10 MED ORDER — CITALOPRAM HYDROBROMIDE 40 MG PO TABS: 40.0000 mg | ORAL_TABLET | Freq: Every day | ORAL | 3 refills | Status: DC

## 2020-08-10 NOTE — Patient Instructions (Addendum)
Thank you for coming to the office today.  Trial on Black Cohosh for supplement for hot flashes. Should not interact with Citalopram  Renewal of Citalopram today.  Back up plan, Clonidine rx BP pill low dose 0.1 nightly only for hot flashes. Occasional other common side effects   Please schedule a Follow-up Appointment to: Return in about 6 months (around 02/10/2021) for 6 month follow-up Anxiety, hot flashes, follow-up consider labs.  If you have any other questions or concerns, please feel free to call the office or send a message through [place]. You may also schedule an earlier appointment if necessary.  Additionally, you may be receiving a survey about your experience at our office within a few days to 1 week by e-mail or mail. We value your feedback.  [name], DO [place]

## 2020-08-10 NOTE — Progress Notes (Signed)
Subjective:    Patient ID: Gloria Harrison, female    DOB: Dec 17, 1965, 56 y.o.   MRN: [id number]  Gloria Harrison is a 55 y.o. female presenting on 08/10/2020 for Anxiety and Depression  Last visit 06/2019 for yearly, now here to follow-up  HPI  Post-menopausal Syndrome Difficulty with sleeping due to hot flashes. 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  Reports chronic history >15-20 years, previous dx by psychiatry, and ultimately her PCP was managing meds, describes symptom mostly intrusive thoughts that are related to anxieties for her and worry - Previously saw Psych and therapy - In past saw Sena Hitch therapist/counselor - [facility] ([place]), she can return when needed Failed Wellbutrin in past from 2018, not effective - Today reports significant work related stress contributing to anxiety but she is managing, and hopeful for improved conditions in future at work - Taking Celexa 40mg  daily, request refill - Admits insomnia, difficulty staying asleep, tried OTC melatonin no relief  Improved RLS off Gabapentin and Lyrica.  Hot flashes now in perimenopause Worse at night. Asking about medication or options. Still has irregular menstrual cycle.   Depression screen [facility] 2/9 08/10/2020 11/12/2018 04/29/2018  Decreased Interest 1 0 1  Down, Depressed, Hopeless 1 0 2  PHQ - 2 Score 2 0 3  Altered sleeping 2 0 3  Tired, decreased energy 1 1 2   Change in appetite 0 0 2  Feeling bad or failure about yourself  1 0 2  Trouble concentrating 1 0 0  Moving slowly or fidgety/restless 0 0 0  Suicidal thoughts 0 0 0  PHQ-9 Score 7 1 12   Difficult doing work/chores Somewhat difficult Not difficult at all Somewhat difficult    Social History   Tobacco Use   Smoking status: Never   Smokeless tobacco: Never  Vaping Use   Vaping Use: Never used  Substance Use Topics   Alcohol use: No    Alcohol/week: 0.0 standard drinks   Drug use: No     Review of Systems Per HPI unless specifically indicated above     Objective:    BP 101/60   Pulse 94   Ht 5\' 2"  (1.575 m)   Wt 148 lb 9.6 oz (67.4 kg)   SpO2 99%   BMI 27.18 kg/m   Wt Readings from Last 3 Encounters:  08/10/20 148 lb 9.6 oz (67.4 kg)  12/02/19 151 lb 3.2 oz (68.6 kg)  07/11/19 155 lb 9.6 oz (70.6 kg)    Physical Exam Vitals and nursing note reviewed.  Constitutional:      General: She is not in acute distress.    Appearance: Normal appearance. She is well-developed. She is not diaphoretic.     Comments: Well-appearing, comfortable, cooperative  HENT:     Head: Normocephalic and atraumatic.  Eyes:     General:        Right eye: No discharge.        Left eye: No discharge.     Conjunctiva/sclera: Conjunctivae normal.  Cardiovascular:     Rate and Rhythm: Normal rate.  Pulmonary:     Effort: Pulmonary effort is normal.  Skin:    General: Skin is warm and dry.     Findings: No erythema or rash.  Neurological:     Mental Status: She is alert and oriented to person, place, and time.  Psychiatric:        Mood and Affect: Mood normal.  Behavior: Behavior normal.        Thought Content: Thought content normal.     Comments: Well groomed, good eye contact, normal speech and thoughts      Results for orders placed or performed in visit on 03/07/20  CBC with Differential/Platelet  Result Value Ref Range   WBC 9.0 4.0 - 10.5 K/uL   RBC 4.55 3.87 - 5.11 MIL/uL   Hemoglobin 13.6 12.0 - 15.0 g/dL   HCT 39.6 36.0 - 46.0 %   MCV 87.0 80.0 - 100.0 fL   MCH 29.9 26.0 - 34.0 pg   MCHC 34.3 30.0 - 36.0 g/dL   RDW 13.5 11.5 - 15.5 %   Platelets 276 150 - 400 K/uL   nRBC 0.0 0.0 - 0.2 %   Neutrophils Relative % 74 %   Neutro Abs 6.7 1.7 - 7.7 K/uL   Lymphocytes Relative 17 %   Lymphs Abs 1.5 0.7 - 4.0 K/uL   Monocytes Relative 7 %   Monocytes Absolute 0.6 0.1 - 1.0 K/uL   Eosinophils Relative 1 %   Eosinophils Absolute 0.1 0.0 - 0.5 K/uL    Basophils Relative 1 %   Basophils Absolute 0.1 0.0 - 0.1 K/uL   Immature Granulocytes 0 %   Abs Immature Granulocytes 0.04 0.00 - 0.07 K/uL  Iron and TIBC  Result Value Ref Range   Iron 115 28 - 170 ug/dL   TIBC 326 250 - 450 ug/dL   Saturation Ratios 35 (H) 10.4 - 31.8 %   UIBC 211 ug/dL  Ferritin  Result Value Ref Range   Ferritin 25 11 - 307 ng/mL      Assessment & Plan:   Problem List Items Addressed This Visit     Restless leg syndrome   OCD (obsessive compulsive disorder)   Relevant Medications   citalopram (CELEXA) 40 MG tablet   Moderate episode of recurrent major depressive disorder (HCC)   Relevant Medications   citalopram (CELEXA) 40 MG tablet   GAD (generalized anxiety disorder)   Relevant Medications   citalopram (CELEXA) 40 MG tablet   Other Visit Diagnoses     Vasomotor flushing    -  Primary   Psychophysiological insomnia       Peri-menopausal           Clinically with chronic anxiety GAD / recurrent depression Insomnia  Controlled on Citalopram 40mg  daily RLS has improved and resolved w/ anemia  Vasomotor flushing/hot flashes PM worst In peri menopause, still has irregular periods We discussed treatment options other SSRI / SNRI, consider Effexor. Previously on Paxil in past. And Gabapentin (did not do well). Offer Clonidine low dose QHS PRN, reconsider in future if interested Trial now on Black Cohosh herbal remedy May f/u with GYN if interested   Meds ordered this encounter  Medications   citalopram (CELEXA) 40 MG tablet    Sig: Take 1 tablet (40 mg total) by mouth daily.    Dispense:  90 tablet    Refill:  3      Follow up plan: Return in about 6 months (around 02/10/2021) for 6 month follow-up Anxiety, hot flashes, follow-up consider labs.   [name], [facility] 08/10/2020, 10:03 AM

## 2020-08-31 ENCOUNTER — Encounter: Payer: Self-pay | Admitting: Obstetrics and Gynecology

## 2020-09-14 ENCOUNTER — Ambulatory Visit
Admission: RE | Admit: 2020-09-14 | Discharge: 2020-09-14 | Disposition: A | Discharge status: Home / Self Care | Payer: 59 | Source: Ambulatory Visit | Attending: Family Medicine | Admitting: Family Medicine

## 2020-09-14 ENCOUNTER — Other Ambulatory Visit: Payer: Self-pay

## 2020-09-14 DIAGNOSIS — Z1231 Encounter for screening mammogram for malignant neoplasm of breast: Secondary | ICD-10-CM | POA: Insufficient documentation

## 2020-09-21 ENCOUNTER — Other Ambulatory Visit (HOSPITAL_COMMUNITY)
Admission: RE | Admit: 2020-09-21 | Discharge: 2020-09-21 | Disposition: A | Discharge status: Home / Self Care | Payer: 59 | Source: Ambulatory Visit | Attending: Obstetrics and Gynecology | Admitting: Obstetrics and Gynecology

## 2020-09-21 ENCOUNTER — Encounter: Payer: Self-pay | Admitting: Obstetrics and Gynecology

## 2020-09-21 ENCOUNTER — Ambulatory Visit (INDEPENDENT_AMBULATORY_CARE_PROVIDER_SITE_OTHER): Payer: 59 | Admitting: Obstetrics and Gynecology

## 2020-09-21 ENCOUNTER — Other Ambulatory Visit: Payer: Self-pay

## 2020-09-21 VITALS — BP 90/60 | Ht 63.0 in | Wt 151.0 lb

## 2020-09-21 DIAGNOSIS — N852 Hypertrophy of uterus: Secondary | ICD-10-CM

## 2020-09-21 DIAGNOSIS — Z0000 Encounter for general adult medical examination without abnormal findings: Secondary | ICD-10-CM | POA: Diagnosis not present

## 2020-09-21 DIAGNOSIS — Z1231 Encounter for screening mammogram for malignant neoplasm of breast: Secondary | ICD-10-CM | POA: Diagnosis not present

## 2020-09-21 DIAGNOSIS — Z124 Encounter for screening for malignant neoplasm of cervix: Secondary | ICD-10-CM

## 2020-09-21 DIAGNOSIS — Z01419 Encounter for gynecological examination (general) (routine) without abnormal findings: Secondary | ICD-10-CM

## 2020-09-21 DIAGNOSIS — N920 Excessive and frequent menstruation with regular cycle: Secondary | ICD-10-CM

## 2020-09-21 NOTE — Patient Instructions (Signed)
Institute of Medicine Recommended Dietary Allowances for Calcium and Vitamin D  Age (yr) Calcium Recommended Dietary Allowance (mg/day) Vitamin D Recommended Dietary Allowance (international units/day)  9-18 1,300 600  19-50 1,000 600  51-70 1,200 600  71 and older 1,200 800  Data from Institute of Medicine. Dietary reference intakes: calcium, vitamin D. [place], [place]: National Academies Press; 2011.   Exercising to Stay Healthy To become healthy and stay healthy, it is recommended that you do moderate-intensity and vigorous-intensity exercise. You can tell that you are exercising at a moderate intensity if your heart starts beating faster and you start breathing faster but can still hold a conversation. You can tell that you are exercising at a vigorous intensity if you are breathing much harder andfaster and cannot hold a conversation while exercising. Exercising regularly is important. It has many health benefits, such as: Improving overall fitness, flexibility, and endurance. Increasing bone density. Helping with weight control. Decreasing body fat. Increasing muscle strength. Reducing stress and tension. Improving overall health. How often should I exercise? Choose an activity that you enjoy, and set realistic goals. Your health careprovider can help you make an activity plan that works for you. Exercise regularly as told by your health care provider. This may include: Doing strength training two times a week, such as: Lifting weights. Using resistance bands. Push-ups. Sit-ups. Yoga. Doing a certain intensity of exercise for a given amount of time. Choose from these options: A total of 150 minutes of moderate-intensity exercise every week. A total of 75 minutes of vigorous-intensity exercise every week. A mix of moderate-intensity and vigorous-intensity exercise every week. Children, pregnant women, people who have not exercised regularly, people who are overweight, and  older adults may need to talk with a health care provider about what activities are safe to do. If you have a medical condition, be sureto talk with your health care provider before you start a new exercise program. What are some exercise ideas? Moderate-intensity exercise ideas include: Walking 1 mile (1.6 km) in about 15 minutes. Biking. Hiking. Golfing. Dancing. Water aerobics. Vigorous-intensity exercise ideas include: Walking 4.5 miles (7.2 km) or more in about 1 hour. Jogging or running 5 miles (8 km) in about 1 hour. Biking 10 miles (16.1 km) or more in about 1 hour. Lap swimming. Roller-skating or in-line skating. Cross-country skiing. Vigorous competitive sports, such as football, basketball, and soccer. Jumping rope. Aerobic dancing. What are some everyday activities that can help me to get exercise? Yard work, such as: Pushing a lawn mower. Raking and bagging leaves. Washing your car. Pushing a stroller. Shoveling snow. Gardening. Washing windows or floors. How can I be more active in my day-to-day activities? Use stairs instead of an elevator. Take a walk during your lunch break. If you drive, park your car farther away from your work or school. If you take public transportation, get off one stop early and walk the rest of the way. Stand up or walk around during all of your indoor phone calls. Get up, stretch, and walk around every 30 minutes throughout the day. Enjoy exercise with a friend. Support to continue exercising will help you keep a regular routine of activity. What guidelines can I follow while exercising? Before you start a new exercise program, talk with your health care provider. Do not exercise so much that you hurt yourself, feel dizzy, or get very short of breath. Wear comfortable clothes and wear shoes with good support. Drink plenty of water while you exercise to prevent   dehydration or heat stroke. Work out until your breathing and your  heartbeat get faster. Where to find more information U.S. Department of Health and Human Services: www.hhs.gov Centers for Disease Control and Prevention (CDC): www.cdc.gov Summary Exercising regularly is important. It will improve your overall fitness, flexibility, and endurance. Regular exercise also will improve your overall health. It can help you control your weight, reduce stress, and improve your bone density. Do not exercise so much that you hurt yourself, feel dizzy, or get very short of breath. Before you start a new exercise program, talk with your health care provider. This information is not intended to replace advice given to you by your health care provider. Make sure you discuss any questions you have with your healthcare provider. Document Revised: 12/30/2019 Document Reviewed: 12/30/2019 Elsevier Patient Education  2022 Elsevier Inc. Budget-Friendly Healthy Eating There are many ways to save money at the grocery store and continue to eat healthy. You can be successful if you: Plan meals according to your budget. Make a grocery list and only purchase food according to your grocery list. Prepare food yourself at home. What are tips for following this plan? Reading food labels Compare food labels between brand name foods and the store brand. Often the nutritional value is the same, but the store brand is lower cost. Look for products that do not have added sugar, fat, or salt (sodium). These often cost the same but are healthier for you. Products may be labeled as: Sugar-free. Nonfat. Low-fat. Sodium-free. Low-sodium. Look for lean ground beef labeled as at least 92% lean and 8% fat. Shopping  Buy only the items on your grocery list and go only to the areas of the store that have the items on your list. Use coupons only for foods and brands you normally buy. Avoid buying items you wouldn't normally buy simply because they are on sale. Check online and in newspapers for  weekly deals. Buy healthy items from the bulk bins when available, such as herbs, spices, flour, pasta, nuts, and dried fruit. Buy fruits and vegetables that are in season. Prices are usually lower on in-season produce. Look at the unit price on the price tag. Use it to compare different brands and sizes to find out which item is the best deal. Choose healthy items that are often low-cost, such as carrots, potatoes, apples, bananas, and oranges. Dried or canned beans are a low-cost protein source. Buy in bulk and freeze extra food. Items you can buy in bulk include meats, fish, poultry, frozen fruits, and frozen vegetables. Avoid buying "ready-to-eat" foods, such as pre-cut fruits and vegetables and pre-made salads. If possible, shop around to discover where you can find the best prices. Consider other retailers such as dollar stores, larger wholesale stores, local fruit and vegetable stands, and farmers markets. Do not shop when you are hungry. If you shop while hungry, it may be hard to stick to your list and budget. Resist impulse buying. Use your grocery list as your official plan for the week. Buy a variety of vegetables and fruits by purchasing fresh, frozen, and canned items. Look at the top and bottom shelves for deals. Foods at eye level (eye level of an adult or child) are usually more expensive. Be efficient with your time when shopping. The more time you spend at the store, the more money you are likely to spend. To save money when choosing more expensive foods like meats and dairy: Choose cheaper cuts of meat, such as bone-in   chicken thighs and drumsticks instead of skinless and boneless chicken. When you are ready to prepare the chicken, you can remove the skin yourself to make it healthier. Choose lean meats like chicken or turkey instead of beef. Choose canned seafood, such as tuna, salmon, or sardines. Buy eggs as a low-cost source of protein. Buy dried beans and peas, such as  lentils, split peas, or kidney beans instead of meats. Dried beans and peas are a good alternative source of protein. Buy the larger tubs of yogurt instead of individual-sized containers. Choose water instead of sodas and other sweetened beverages. Avoid buying chips, cookies, and other "junk food." These items are usually expensive and not healthy.  Cooking Make extra food and freeze the extras in meal-sized containers or in individual portions for fast meals and snacks. Pre-cook on days when you have extra time to prepare meals in advance. You can keep these meals in the fridge or freezer and reheat for a quick meal. When you come home from the grocery store, wash, peel, and cut fruits and vegetables so they are ready to use and eat. This will help reduce food waste. Meal planning Do not eat out or get fast food. Prepare food at home. Make a grocery list and make sure to bring it with you to the store. If you have a smart phone, you could use your phone to create your shopping list. Plan meals and snacks according to a grocery list and budget you create. Use leftovers in your meal plan for the week. Look for recipes where you can cook once and make enough food for two meals. Prepare budget-friendly types of meals like stews, casseroles, and stir-fry dishes. Try some meatless meals or try "no cook" meals like salads. Make sure that half your plate is filled with fruits or vegetables. Choose from fresh, frozen, or canned fruits and vegetables. If eating canned, remember to rinse them before eating. This will remove any excess salt added for packaging. Summary Eating healthy on a budget is possible if you plan your meals according to your budget, purchase according to your budget and grocery list, and prepare food yourself. Tips for buying more food on a limited budget include buying generic brands, using coupons only for foods you normally buy, and buying healthy items from the bulk bins when  available. Tips for buying cheaper food to replace expensive food include choosing cheaper, lean cuts of meat, and buying dried beans and peas. This information is not intended to replace advice given to you by your health care provider. Make sure you discuss any questions you have with your healthcare provider. Document Revised: 10/27/2019 Document Reviewed: 10/27/2019 Elsevier Patient Education  2022 Elsevier Inc. Bone Health Bones protect organs, store calcium, anchor muscles, and support the whole body. Keeping your bones strong is important, especially as you get older. Youcan take actions to help keep your bones strong and healthy. Why is keeping my bones healthy important?  Keeping your bones healthy is important because your body constantly replaces bone cells. Cells get old, and new cells take their place. As we age, we lose bone cells because the body may not be able to make enough new cells to replace the old cells. The amount of bone cells and bone tissue you have is referred toas bone mass. The higher your bone mass, the stronger your bones. The aging process leads to an overall loss of bone mass in the body, which can increase the likelihood of: Joint pain   and stiffness. Broken bones. A condition in which the bones become weak and brittle (osteoporosis). A large decline in bone mass occurs in older adults. In women, it occurs aboutthe time of menopause. What actions can I take to keep my bones healthy? Good health habits are important for maintaining healthy bones. This includes eating nutritious foods and exercising regularly. To have healthy bones, you need to get enough of the right minerals and vitamins. Most nutrition experts recommend getting these nutrients from the foods that you eat. In some cases, taking supplements may also be recommended. Doing certain types of exercise isalso important for bone health. What are the nutritional recommendations for healthy bones?  Eating  a well-balanced diet with plenty of calcium and vitamin D will help to protect your bones. Nutritional recommendations vary from person to person. Ask your health care provider what is healthy for you. Here are some generalguidelines. Get enough calcium Calcium is the most important (essential) mineral for bone health. Most people can get enough calcium from their diet, but supplements may be recommended for people who are at risk for osteoporosis. Good sources of calcium include: Dairy products, such as low-fat or nonfat milk, cheese, and yogurt. Dark green leafy vegetables, such as bok choy and broccoli. Calcium-fortified foods, such as orange juice, cereal, bread, soy beverages, and tofu products. Nuts, such as almonds. Follow these recommended amounts for daily calcium intake: Children, age 1-3: 700 mg. Children, age 4-8: 1,000 mg. Children, age 9-13: 1,300 mg. Teens, age 14-18: 1,300 mg. Adults, age 19-50: 1,000 mg. Adults, age 51-70: Men: 1,000 mg. Women: 1,200 mg. Adults, age 71 or older: 1,200 mg. Pregnant and breastfeeding females: Teens: 1,300 mg. Adults: 1,000 mg. Get enough vitamin D Vitamin D is the most essential vitamin for bone health. It helps the body absorb calcium. Sunlight stimulates the skin to make vitamin D, so be sure to get enough sunlight. If you live in a cold climate or you do not get outside often, your health care provider may recommend that you take vitamin D supplements. Good sources of vitamin D in your diet include: Egg yolks. Saltwater fish. Milk and cereal fortified with vitamin D. Follow these recommended amounts for daily vitamin D intake: Children and teens, age 1-18: 600 international units. Adults, age 50 or younger: 400-800 international units. Adults, age 51 or older: 800-1,000 international units. Get other important nutrients Other nutrients that are important for bone health include: Phosphorus. This mineral is found in meat, poultry,  dairy foods, nuts, and legumes. The recommended daily intake for adult men and adult women is 700 mg. Magnesium. This mineral is found in seeds, nuts, dark green vegetables, and legumes. The recommended daily intake for adult men is 400-420 mg. For adult women, it is 310-320 mg. Vitamin K. This vitamin is found in green leafy vegetables. The recommended daily intake is 120 mg for adult men and 90 mg for adult women. What type of physical activity is best for building and maintaining healthybones? Weight-bearing and strength-building activities are important for building and maintaining healthy bones. Weight-bearing activities cause muscles and bones to work against gravity. Strength-building activities increase the strength of the muscles that support bones. Weight-bearing and muscle-building activities include: Walking and hiking. Jogging and running. Dancing. Gym exercises. Lifting weights. Tennis and racquetball. Climbing stairs. Aerobics. Adults should get at least 30 minutes of moderate physical activity on most days. Children should get at least 60 minutes of moderate physical activity onmost days. Ask your health care   provider what type of exercise is best for you. How can I find out if my bone mass is low? Bone mass can be measured with an X-ray test called a bone mineral density (BMD) test. This test is recommended for all women who are age 65 or older. It may also be recommended for: Men who are age 70 or older. People who are at risk for osteoporosis because of: Having bones that break easily. Having a long-term disease that weakens bones, such as kidney disease or rheumatoid arthritis. Having menopause earlier than normal. Taking medicine that weakens bones, such as steroids, thyroid hormones, or hormone treatment for breast cancer or prostate cancer. Smoking. Drinking three or more alcoholic drinks a day. If you find that you have a low bone mass, you may be able to  preventosteoporosis or further bone loss by changing your diet and lifestyle. Where can I find more information? For more information, check out the following websites: National Osteoporosis Foundation: www.nof.org/patients National Institutes of Health: www.bones.nih.gov International Osteoporosis Foundation: www.iofbonehealth.org Summary The aging process leads to an overall loss of bone mass in the body, which can increase the likelihood of broken bones and osteoporosis. Eating a well-balanced diet with plenty of calcium and vitamin D will help to protect your bones. Weight-bearing and strength-building activities are also important for building and maintaining strong bones. Bone mass can be measured with an X-ray test called a bone mineral density (BMD) test. This information is not intended to replace advice given to you by your health care provider. Make sure you discuss any questions you have with your healthcare provider. Document Revised: 02/09/2017 Document Reviewed: 02/09/2017 Elsevier Patient Education  2022 Elsevier Inc.  

## 2020-09-21 NOTE — Progress Notes (Signed)
Gynecology Annual Exam  PCP: Olin Hauser, DO  Chief Complaint:  Chief Complaint  Patient presents with   Gynecologic Exam    No concerns    History of Present Illness: Patient is a 55 y.o. [id number] presents for annual exam. The patient has no complaints today.   LMP: Patient's last menstrual period was 08/29/2020 (approximate). Average Interval: monthly Duration of flow: 5 days  Heavy Menses: yes Dysmenorrhea: no  She denies passage of large clots She reports sensations of gushing or flooding of blood. She reports accidents where she bleeds through her clothing. She denies that she changes a saturated pad or tampon more frequently than every hour.  She denies that pain from her periods limits her activities.  The patient does perform self breast exams.  There is no notable family history of breast or ovarian cancer in her family.  The patient has regular exercise: not currently  The patient denies current symptoms of depression.    Review of Systems: Review of Systems  Constitutional:  Negative for chills, fever, malaise/fatigue and weight loss.  HENT:  Negative for congestion, hearing loss and sinus pain.   Eyes:  Negative for blurred vision and double vision.  Respiratory:  Negative for cough, sputum production, shortness of breath and wheezing.   Cardiovascular:  Negative for chest pain, palpitations, orthopnea and leg swelling.  Gastrointestinal:  Negative for abdominal pain, constipation, diarrhea, nausea and vomiting.  Genitourinary:  Negative for dysuria, flank pain, frequency, hematuria and urgency.  Musculoskeletal:  Negative for back pain, falls and joint pain.  Skin:  Negative for itching and rash.  Neurological:  Negative for dizziness and headaches.  Psychiatric/Behavioral:  Negative for depression, substance abuse and suicidal ideas. The patient is not nervous/anxious.    Past Medical History:  Past Medical History:  Diagnosis Date    Chronic kidney disease    GERD (gastroesophageal reflux disease)    OCD (obsessive compulsive disorder)     Past Surgical History:  Past Surgical History:  Procedure Laterality Date   CESAREAN SECTION  1996   CESAREAN SECTION  2001   COLONOSCOPY WITH PROPOFOL N/A 03/24/2018   Procedure: COLONOSCOPY WITH PROPOFOL;  Surgeon: Virgel Manifold, MD;  Location: [facility] ENDOSCOPY;  Service: Endoscopy;  Laterality: N/A;   COSMETIC SURGERY  2002   Tummy tuck   ESOPHAGOGASTRODUODENOSCOPY (EGD) WITH PROPOFOL N/A 03/24/2018   Procedure: ESOPHAGOGASTRODUODENOSCOPY (EGD) WITH PROPOFOL;  Surgeon: Virgel Manifold, MD;  Location: [facility] ENDOSCOPY;  Service: Endoscopy;  Laterality: N/A;   LITHOTRIPSY  2006 & 2008   TONSILLECTOMY  1990    Gynecologic History:  Patient's last menstrual period was 08/29/2020 (approximate). Menarche: 15  History of fibroids, polyps, or ovarian cysts? : no  History of PCOS? no Hstory of Endometriosis? no History of abnormal pap smears? Yes , when she was younger per patient Have you had any sexually transmitted infections in the past?  no Last Pap: Results were: 2019 NIL  She identifies as a female. She is sexually active with men.   She denies dyspareunia. She denies postcoital bleeding.    Obstetric History: [id number]  Family History:  Family History  Problem Relation Age of Onset   Kidney Stones Mother    Hyperlipidemia Mother    Hyperlipidemia Father    Hypertension Father    Cancer Father        Jaw tumor   Asthma Son    Suicidality Son    Depression Son    Hypothyroidism  Sister    Prostate cancer Maternal Uncle    Diabetes Maternal Uncle    Colon cancer Neg Hx     Social History:  Social History   Socioeconomic History   Marital status: Married    Spouse name: Not on file   Number of children: Not on file   Years of education: Not on file   Highest education level: Not on file  Occupational History   Occupation: Special educational needs teacher    Occupation: Administrator, arts   Tobacco Use   Smoking status: Never   Smokeless tobacco: Never  Vaping Use   Vaping Use: Never used  Substance and Sexual Activity   Alcohol use: No    Alcohol/week: 0.0 standard drinks   Drug use: No   Sexual activity: Yes    Partners: Male    Birth control/protection: Other-see comments    Comment: Husband had a Vasectomy.  Other Topics Concern   Not on file  Social History Narrative   Not on file   Social Determinants of Health   Financial Resource Strain: Not on file  Food Insecurity: Not on file  Transportation Needs: Not on file  Physical Activity: Not on file  Stress: Not on file  Social Connections: Not on file  Intimate Partner Violence: Not on file    Allergies:  Allergies  Allergen Reactions   Ciprofloxacin Hives   Ropinirole Nausea Only    Medications: Prior to Admission medications   Medication Sig Start Date End Date Taking? Authorizing Provider  citalopram (CELEXA) 40 MG tablet Take 1 tablet (40 mg total) by mouth daily. 08/10/20  Yes Karamalegos, Devonne Doughty, DO  LOTEMAX 0.5 % OINT  08/24/20  Yes [provider]  pantoprazole (PROTONIX) 40 MG tablet TAKE 1 TABLET BY MOUTH ONCE DAILY BEFORE BREAKFAST 07/19/19  Yes Karamalegos, Devonne Doughty, DO  Propylene Glycol 0.6 % SOLN Apply to eye.   Yes [provider]    Physical Exam Vitals: Blood pressure 90/60, height '5\' 3"'$  (1.6 m), weight 151 lb (68.5 kg), last menstrual period 08/29/2020.  Physical Exam Constitutional:      Appearance: She is well-developed.  Genitourinary:     Genitourinary Comments: External: Normal appearing vulva. No lesions noted.  Speculum examination: Normal appearing cervix. No blood in the vaginal vault. No discharge.   Bimanual examination: Uterus enlarged to approx 12 cm and bulky.  No CMT. No adnexal masses. No adnexal tenderness. Pelvis not fixed.  Breast Exam: breast equal without skin changes, nipple discharge,  breast lump or enlarged lymph nodes   HENT:     Head: Normocephalic and atraumatic.  Neck:     Thyroid: No thyromegaly.  Cardiovascular:     Rate and Rhythm: Normal rate and regular rhythm.     Heart sounds: Normal heart sounds.  Pulmonary:     Effort: Pulmonary effort is normal.     Breath sounds: Normal breath sounds.  Abdominal:     General: Bowel sounds are normal. There is no distension.     Palpations: Abdomen is soft. There is no mass.  Musculoskeletal:     Cervical back: Neck supple.  Neurological:     Mental Status: She is alert and oriented to person, place, and time.  Skin:    General: Skin is warm and dry.  Psychiatric:        Behavior: Behavior normal.        Thought Content: Thought content normal.  Judgment: Judgment normal.  Vitals reviewed.     Female chaperone present for pelvic and breast  portions of the physical exam  Assessment: 55 y.o. [id number] routine annual exam  Plan: Problem List Items Addressed This Visit   None Visit Diagnoses     Encounter for annual routine gynecological examination    -  Primary   Health maintenance examination       Breast cancer screening by mammogram       Cervical cancer screening       Relevant Orders   Cytology - PAP   Encounter for gynecological examination without abnormal finding       Menorrhagia with regular cycle       Relevant Orders   US PELVIC COMPLETE WITH TRANSVAGINAL   Bulky or enlarged uterus       Relevant Orders   US PELVIC COMPLETE WITH TRANSVAGINAL       1) Mammogram - recommend yearly screening mammogram.  Mammogram Is up to date  2) STI screening was offered and declined  3) ASCCP guidelines and rational discussed.  Pap smear today  4) Contraception - none desired  5) Colonoscopy -- last in 2020, repeat in 5 years recommended  6) Routine healthcare maintenance including cholesterol, diabetes screening discussed managed by PCP  7) Bulky uterus- will obtain pelvic US and  follow up with patient.  Adrian Prows MD, [facility], [facility] 09/21/2020 11:06 AM

## 2020-09-26 LAB — CYTOLOGY - PAP
Comment: NEGATIVE
Diagnosis: NEGATIVE
High risk HPV: NEGATIVE

## 2020-10-08 ENCOUNTER — Ambulatory Visit: Payer: 59

## 2020-10-12 ENCOUNTER — Ambulatory Visit: Payer: 59

## 2020-10-22 ENCOUNTER — Ambulatory Visit: Payer: 59 | Admitting: Obstetrics and Gynecology

## 2021-01-08 ENCOUNTER — Ambulatory Visit: Payer: 59 | Admitting: Family Medicine

## 2021-01-08 ENCOUNTER — Encounter: Payer: Self-pay | Admitting: Family Medicine

## 2021-01-08 ENCOUNTER — Other Ambulatory Visit: Payer: Self-pay

## 2021-01-08 ENCOUNTER — Other Ambulatory Visit: Payer: Self-pay | Admitting: Family Medicine

## 2021-01-08 VITALS — BP 113/60 | HR 94 | Ht 63.0 in | Wt 155.2 lb

## 2021-01-08 DIAGNOSIS — J111 Influenza due to unidentified influenza virus with other respiratory manifestations: Secondary | ICD-10-CM | POA: Diagnosis not present

## 2021-01-08 DIAGNOSIS — B349 Viral infection, unspecified: Secondary | ICD-10-CM | POA: Diagnosis not present

## 2021-01-08 DIAGNOSIS — K219 Gastro-esophageal reflux disease without esophagitis: Secondary | ICD-10-CM

## 2021-01-08 MED ORDER — OSELTAMIVIR PHOSPHATE 75 MG PO CAPS: 75.0000 mg | ORAL_CAPSULE | Freq: Two times a day (BID) | ORAL | 0 refills | Status: DC

## 2021-01-08 MED ORDER — PANTOPRAZOLE SODIUM 40 MG PO TBEC: 40.0000 mg | DELAYED_RELEASE_TABLET | Freq: Every day | ORAL | 3 refills | Status: DC

## 2021-01-08 NOTE — Progress Notes (Signed)
Subjective:    Patient ID: Gloria Harrison, female    DOB: 1965/09/07, 55 y.o.   MRN: [id number]  Gloria Harrison is a 55 y.o. female presenting on 01/08/2021 for Nasal Congestion, Generalized Body Aches, and Headache  Patient presents for a same day appointment.  HPI  Acute Viral Syndrome Onset 4 days, nasal congestion body aches, headache, sore throat. No known sick contact, also ear pain R > L Taking 1000mg  Tylenol PRN No covid test done at home, has a test can do later. No flu vaccine this season, she had original COVID shot but not booster Denies any fever, nausea vomiting, diarrhea, dyspnea wheezing  Depression screen [facility] 2/9 08/10/2020 11/12/2018 04/29/2018  Decreased Interest 1 0 1  Down, Depressed, Hopeless 1 0 2  PHQ - 2 Score 2 0 3  Altered sleeping 2 0 3  Tired, decreased energy 1 1 2   Change in appetite 0 0 2  Feeling bad or failure about yourself  1 0 2  Trouble concentrating 1 0 0  Moving slowly or fidgety/restless 0 0 0  Suicidal thoughts 0 0 0  PHQ-9 Score 7 1 12   Difficult doing work/chores Somewhat difficult Not difficult at all Somewhat difficult    Social History   Tobacco Use   Smoking status: Never   Smokeless tobacco: Never  Vaping Use   Vaping Use: Never used  Substance Use Topics   Alcohol use: No    Alcohol/week: 0.0 standard drinks   Drug use: No    Review of Systems Per HPI unless specifically indicated above     Objective:    BP 113/60    Pulse 94    Ht 5\' 3"  (1.6 m)    Wt 155 lb 3.2 oz (70.4 kg)    SpO2 99%    BMI 27.49 kg/m   Wt Readings from Last 3 Encounters:  01/08/21 155 lb 3.2 oz (70.4 kg)  09/21/20 151 lb (68.5 kg)  08/10/20 148 lb 9.6 oz (67.4 kg)    Physical Exam Vitals and nursing note reviewed.  Constitutional:      General: She is not in acute distress.    Appearance: She is well-developed. She is not diaphoretic.     Comments: Well-appearing, comfortable, cooperative  HENT:     Head: Normocephalic and atraumatic.   Eyes:     General:        Right eye: No discharge.        Left eye: No discharge.     Conjunctiva/sclera: Conjunctivae normal.  Neck:     Thyroid: No thyromegaly.  Cardiovascular:     Rate and Rhythm: Normal rate and regular rhythm.     Heart sounds: Normal heart sounds. No murmur heard. Pulmonary:     Effort: Pulmonary effort is normal. No respiratory distress.     Breath sounds: Normal breath sounds. No wheezing or rales.  Musculoskeletal:        General: Normal range of motion.     Cervical back: Normal range of motion and neck supple.  Lymphadenopathy:     Cervical: No cervical adenopathy.  Skin:    General: Skin is warm and dry.     Findings: No erythema or rash.  Neurological:     Mental Status: She is alert and oriented to person, place, and time.  Psychiatric:        Behavior: Behavior normal.     Comments: Well groomed, good eye contact, normal speech and thoughts  Results for orders placed or performed in visit on 09/21/20  Cytology - PAP  Result Value Ref Range   High risk HPV Negative    Adequacy      Satisfactory for evaluation; transformation zone component PRESENT.   Diagnosis      - Negative for intraepithelial lesion or malignancy (NILM)   Comment Normal Reference Range HPV - Negative       Assessment & Plan:   Problem List Items Addressed This Visit   None Visit Diagnoses     Acute viral syndrome    -  Primary   Relevant Medications   oseltamivir (TAMIFLU) 75 MG capsule   Other Relevant Orders   COVID-19, Flu A+B and RSV   Influenza       Relevant Medications   oseltamivir (TAMIFLU) 75 MG capsule   Other Relevant Orders   COVID-19, Flu A+B and RSV       Clinically consistent with flu vs flu-like respiratory viral illness Cannot rule out COVID, RSV or other Proceed w/ swab test today COVID FLU RSV pending result. Send out [employer], work note written  - No other focal findings of infection today - Did not receive influenza  vaccine this season  Plan: Pending test result - will print empiric Tamiflu 75mg  capsules BID x 5 days Supportive care as advised with NSAID / Tylenol PRN fever/myalgias, improve hydration, may take OTC Cold/Flu meds Recommend Decongestant w/ Sudafed  Return criteria given if significant worsening, consider post-influenza complications, otherwise follow-up if needed  If COVID positive can offer anti viral or other supportive meds.  Meds ordered this encounter  Medications   oseltamivir (TAMIFLU) 75 MG capsule    Sig: Take 1 capsule (75 mg total) by mouth 2 (two) times daily. For 5 days    Dispense:  10 capsule    Refill:  0      Follow up plan: Return if symptoms worsen or fail to improve.   [name], [facility] 01/08/2021, 3:33 PM

## 2021-01-08 NOTE — Patient Instructions (Addendum)
Thank you for coming to the office today.  Flu COVID RSV testing today 24 hr result  Stay tuned if COVID can offer paxlovid  If Flu  take tamiflu printed rx  - For symptom control (these are optional OTC medicines)      - Take Ibuprofen / Advil 400-600mg  every 6-8 hours as needed for fever / muscle aches, and may also take Tylenol 500-1000mg  per dose every 6-8 hours or 3 times a day, can alternate dosing     - May try Sudafed OTC for decongestant for headache.     - May try OTC Mucinex up to 7-10 days then stop  If prescribed for you      - Start Tessalon perls one every 8 hours or 3 times a day as needed for cough      - Start Atrovent nasal spray decongestant 2 sprays in each nostril up to 4 times daily for 7 days  - Wash hands and cover cough very well to avoid spread of infection - Improve hydration with plenty of clear fluids    If significant worsening with poor fluid intake, worsening fever, difficulty breathing due to coughing, worsening body aches, weakness, or other more concerning symptoms difficulty breathing you can seek treatment at Emergency Department. Also if improved flu symptoms and then worsening days to week later with concerns for bronchitis, productive cough fever chills again we may need to check for possible pneumonia that can occur after the flu    Please schedule a Follow-up Appointment to: Return if symptoms worsen or fail to improve.  If you have any other questions or concerns, please feel free to call the office or send a message through [place]. You may also schedule an earlier appointment if necessary.  Additionally, you may be receiving a survey about your experience at our office within a few days to 1 week by e-mail or mail. We value your feedback.  [name], DO [place]

## 2021-01-09 ENCOUNTER — Telehealth: Payer: Self-pay

## 2021-01-09 DIAGNOSIS — U071 COVID-19: Secondary | ICD-10-CM

## 2021-01-09 LAB — COVID-19, FLU A+B AND RSV
Influenza A, NAA: NOT DETECTED
Influenza B, NAA: NOT DETECTED
RSV, NAA: NOT DETECTED
SARS-CoV-2, NAA: DETECTED — AB

## 2021-01-09 MED ORDER — NIRMATRELVIR/RITONAVIR (PAXLOVID)TABLET: 3.0000 | ORAL_TABLET | Freq: Two times a day (BID) | ORAL | 0 refills | Status: AC

## 2021-01-09 NOTE — Telephone Encounter (Signed)
Thank you. Yes I am aware of this result now. Did not get a chance to review the result during the afternoon clinic session.  I did speak with patient now and answered her questions, ordered Paxlovid to her pharmacy after reviewing the medication with her. She is on quarantine for 5 days now since positive test. Able to return after 5 days, if fever free, and will still wear mask up to 10 days  [name], [facility] 01/09/2021, 4:56 PM

## 2021-01-09 NOTE — Telephone Encounter (Signed)
Pt called back to see if antiviral meds were being sent in. Called and spoke with Rachell, [place] who states she will go let Dr. Raliegh Ip know about this. Advised pt she can take the Tamiflu medication and see if that helps with symptoms just in case the med isn't sent in this evening. Pt states she will have it filled just be to on safe side.

## 2021-01-09 NOTE — Telephone Encounter (Signed)
Pt given COVID results, positive. Pt is asking if anti-viral meds can be called into [place], [place]

## 2021-01-09 NOTE — Addendum Note (Signed)
Addended by: Olin Hauser on: 01/09/2021 04:56 PM   Modules accepted: Orders

## 2021-01-10 ENCOUNTER — Ambulatory Visit: Payer: Self-pay

## 2021-01-10 NOTE — Telephone Encounter (Signed)
This is a duplicate request. It was dated yesterday. The other telephone note has my note in it, I already talked to her yesterday 12/14 and ordered the med  [name], [facility] 01/10/2021, 9:03 AM

## 2021-01-10 NOTE — Telephone Encounter (Addendum)
°  Chief Complaint: Sore throat, COVID positive Symptoms: Other symptoms are better, but did not sleep last night because of throat pain Frequency: Started yesterday Pertinent Negatives: Patient denies Fever Disposition: [] ED /[] Urgent Care (no appt availability in office) / [] Appointment(In office/virtual)/ []  [facility]/ [] Home Care/ [] Refused Recommended Disposition  Additional Notes: Has taken Tylenol, Motrin, Sudafed and cold medication without relief . History of tonsillectomy.Asking for medication to be sent to pharmacy. Please advise pt.     Answer Assessment - Initial Assessment Questions 1. ONSET: "When did the throat start hurting?" (Hours or days ago)      Yesterday 2. SEVERITY: "How bad is the sore throat?" (Scale 1-10; mild, moderate or severe)   - MILD (1-3):  doesn't interfere with eating or normal activities   - MODERATE (4-7): interferes with eating some solids and normal activities   - SEVERE (8-10):  excruciating pain, interferes with most normal activities   - SEVERE DYSPHAGIA: can't swallow liquids, drooling     Moderate 3. STREP EXPOSURE: "Has there been any exposure to strep within the past week?" If Yes, ask: "What type of contact occurred?"      No 4.  VIRAL SYMPTOMS: "Are there any symptoms of a cold, such as a runny nose, cough, hoarse voice or red eyes?"      COVID positive 5. FEVER: "Do you have a fever?" If Yes, ask: "What is your temperature, how was it measured, and when did it start?"     No 6. PUS ON THE TONSILS: "Is there pus on the tonsils in the back of your throat?"     Unsure 7. OTHER SYMPTOMS: "Do you have any other symptoms?" (e.g., difficulty breathing, headache, rash)     Body aches 8. PREGNANCY: "Is there any chance you are pregnant?" "When was your last menstrual period?"     No  Protocols used: Sore Throat-A-AH

## 2021-01-11 ENCOUNTER — Telehealth: Payer: Self-pay | Admitting: Family Medicine

## 2021-01-11 NOTE — Telephone Encounter (Signed)
Spoke with the pt's husband about treatment plan. They are clear that the paxlovid will help with all her symptoms and that there is nothing separately from that that will cure the sore throat. Dr. Raliegh Ip said for her to use OTC meds and throat lozenges to ease the soreness.

## 2021-01-11 NOTE — Telephone Encounter (Unsigned)
Husband calling again this morning b/c he did not hear anything back from calling yesterday.  Pt is covid positive.  However pt has a severe sore throat.  He is calling b/c she cannot talk. He thought something was to be called in for her sore throat.   He would like a call back to advise please.  [place], [place]

## 2021-03-27 HISTORY — PX: PARTIAL HYSTERECTOMY: SHX80

## 2021-04-15 ENCOUNTER — Ambulatory Visit (HOSPITAL_BASED_OUTPATIENT_CLINIC_OR_DEPARTMENT_OTHER): Admit: 2021-04-15 | Payer: 59 | Admitting: Obstetrics and Gynecology

## 2021-04-15 ENCOUNTER — Encounter (HOSPITAL_BASED_OUTPATIENT_CLINIC_OR_DEPARTMENT_OTHER): Payer: Self-pay

## 2021-04-15 SURGERY — DILATATION AND CURETTAGE /HYSTEROSCOPY
Anesthesia: Choice

## 2021-07-22 ENCOUNTER — Other Ambulatory Visit: Payer: Self-pay | Admitting: Family Medicine

## 2021-07-22 DIAGNOSIS — K219 Gastro-esophageal reflux disease without esophagitis: Secondary | ICD-10-CM

## 2021-07-23 MED ORDER — PANTOPRAZOLE SODIUM 40 MG PO TBEC: 40.0000 mg | DELAYED_RELEASE_TABLET | Freq: Every day | ORAL | 3 refills | Status: DC

## 2021-08-21 ENCOUNTER — Other Ambulatory Visit: Payer: Self-pay | Admitting: Family Medicine

## 2021-08-21 DIAGNOSIS — Z1231 Encounter for screening mammogram for malignant neoplasm of breast: Secondary | ICD-10-CM

## 2021-08-27 ENCOUNTER — Other Ambulatory Visit: Payer: Self-pay | Admitting: Family Medicine

## 2021-08-27 DIAGNOSIS — F428 Other obsessive-compulsive disorder: Secondary | ICD-10-CM

## 2021-08-27 NOTE — Telephone Encounter (Signed)
Medication Refill - Medication: citalopram (CELEXA) 40 MG tablet  Has the patient contacted their pharmacy? No.   Preferred Pharmacy (with phone number or street name):  [place], [address] Phone:  [phone]  Fax:  [phone]     Has the patient been seen for an appointment in the last year OR does the patient have an upcoming appointment? Yes.    Patient states she only has 5 pills left

## 2021-08-28 MED ORDER — CITALOPRAM HYDROBROMIDE 40 MG PO TABS: 40.0000 mg | ORAL_TABLET | Freq: Every day | ORAL | 0 refills | Status: DC

## 2021-08-28 NOTE — Telephone Encounter (Signed)
Requested Prescriptions  Pending Prescriptions Disp Refills  . citalopram (CELEXA) 40 MG tablet 90 tablet 3    Sig: Take 1 tablet (40 mg total) by mouth daily.     Psychiatry:  Antidepressants - SSRI Failed - 08/27/2021  1:17 PM      Failed - Completed PHQ-2 or PHQ-9 in the last 360 days      Failed - Valid encounter within last 6 months    Recent Outpatient Visits          7 months ago Acute viral syndrome   [place], DO   1 year ago Vasomotor flushing   [place], DO   2 years ago External hemorrhoid, thrombosed   [place], DO   2 years ago Annual physical exam   [place], DO   3 years ago Restless leg syndrome   [facility] [place], Devonne Doughty, DO      Future Appointments            In 1 week [place], Devonne Doughty, [facility], [facility]

## 2021-09-06 ENCOUNTER — Telehealth (INDEPENDENT_AMBULATORY_CARE_PROVIDER_SITE_OTHER): Payer: 59 | Admitting: Family Medicine

## 2021-09-06 ENCOUNTER — Encounter: Payer: Self-pay | Admitting: Family Medicine

## 2021-09-06 VITALS — Ht 63.0 in | Wt 155.0 lb

## 2021-09-06 DIAGNOSIS — F411 Generalized anxiety disorder: Secondary | ICD-10-CM | POA: Diagnosis not present

## 2021-09-06 DIAGNOSIS — F428 Other obsessive-compulsive disorder: Secondary | ICD-10-CM

## 2021-09-06 DIAGNOSIS — F5104 Psychophysiologic insomnia: Secondary | ICD-10-CM

## 2021-09-06 MED ORDER — CITALOPRAM HYDROBROMIDE 40 MG PO TABS: 40.0000 mg | ORAL_TABLET | Freq: Every day | ORAL | 3 refills | Status: DC

## 2021-09-06 MED ORDER — BUSPIRONE HCL 10 MG PO TABS: 10.0000 mg | ORAL_TABLET | Freq: Three times a day (TID) | ORAL | 2 refills | Status: DC | PRN

## 2021-09-06 NOTE — Patient Instructions (Signed)
° °  Please schedule a Follow-up Appointment to: No follow-ups on file. ° °If you have any other questions or concerns, please feel free to call the office or send a message through MyChart. You may also schedule an earlier appointment if necessary. ° °Additionally, you may be receiving a survey about your experience at our office within a few days to 1 week by e-mail or mail. We value your feedback. ° °Veasna Santibanez, DO °[facility], [facility] °

## 2021-09-06 NOTE — Progress Notes (Signed)
Subjective:    Patient ID: Gloria Harrison, female    DOB: March 18, 1965, 56 y.o.   MRN: [id number]  Gloria Harrison is a 56 y.o. female presenting on 09/06/2021 for OCD and Anxiety  Virtual / Telehealth Encounter - Video Visit via MyChart The purpose of this virtual visit is to provide medical care while limiting exposure to the novel coronavirus (COVID19) for both patient and office staff.  Consent was obtained for remote visit:  Yes.   Answered questions that patient had about telehealth interaction:  Yes.   I discussed the limitations, risks, security and privacy concerns of performing an evaluation and management service by video/telephone. I also discussed with the patient that there may be a patient responsible charge related to this service. The patient expressed understanding and agreed to proceed.  Patient Location: Home Provider Location: [facility] (Office)  Participants in virtual visit: - Patient: Gloria Harrison - CMA: Orinda Kenner, CMA - Provider: Dr Parks Ranger   HPI  Obessive-Compulsive Disorder (OCD) / Generalized Anxiety with panic   Reports chronic history >15-20 years, previous dx by psychiatry, and ultimately her PCP was managing meds, describes symptom mostly intrusive thoughts that are related to anxieties for her and worry - Previously saw Psych and therapy - In past saw Sena Hitch therapist/counselor - [facility] ([place])  No longer has counselor or therapist  Failed Wellbutrin in past from 2018, not effective  Today reports significant worsening anxiety, she has needed refill on Celexa, only has 30 day supply, unsure if it is helping. She had been on Sertraline before that worked too.  She said her OCD is worsening now bothering her more. 3 to 4 months with more panic symptoms.  Worse recent changes with s/p hysterectomy partial has ovaries and this has disrupted her overall. See below  - Taking Celexa '40mg'$  daily, request  refill - Admits insomnia, difficulty staying asleep, tried OTC melatonin no relief   Improved RLS off Gabapentin and Lyrica.  S/p partial hysterectomy and has ovaries Difficulty with hot flashes and difficulty sleeping She is on Estradiol patch and also on trazodone for sleep       08/10/2020    9:30 AM 11/12/2018    4:02 PM 04/29/2018    8:51 AM  Depression screen PHQ 2/9  Decreased Interest 1 0 1  Down, Depressed, Hopeless 1 0 2  PHQ - 2 Score 2 0 3  Altered sleeping 2 0 3  Tired, decreased energy '1 1 2  '$ Change in appetite 0 0 2  Feeling bad or failure about yourself  1 0 2  Trouble concentrating 1 0 0  Moving slowly or fidgety/restless 0 0 0  Suicidal thoughts 0 0 0  PHQ-9 Score '7 1 12  '$ Difficult doing work/chores Somewhat difficult Not difficult at all Somewhat difficult    Social History   Tobacco Use   Smoking status: Never   Smokeless tobacco: Never  Vaping Use   Vaping Use: Never used  Substance Use Topics   Alcohol use: No    Alcohol/week: 0.0 standard drinks of alcohol   Drug use: No    Review of Systems Per HPI unless specifically indicated above     Objective:    Ht '5\' 3"'$  (1.6 m)   Wt 155 lb (70.3 kg)   BMI 27.46 kg/m   Wt Readings from Last 3 Encounters:  09/06/21 155 lb (70.3 kg)  01/08/21 155 lb 3.2 oz (70.4 kg)  09/21/20 151 lb (68.5 kg)    Physical Exam  Note examination was completely remotely via video observation objective data only  Gen - well-appearing, no acute distress or apparent pain, comfortable HEENT - eyes appear clear without discharge or redness Heart/Lungs - cannot examine virtually - observed no evidence of coughing or labored breathing. Abd - cannot examine virtually  Skin - face visible today- no rash Neuro - awake, alert, oriented Psych - not anxious appearing   Results for orders placed or performed in visit on 01/08/21  COVID-19, Flu A+B and RSV  Result Value Ref Range   SARS-CoV-2, NAA Detected (A) Not  Detected   Influenza A, NAA Not Detected Not Detected   Influenza B, NAA Not Detected Not Detected   RSV, NAA Not Detected Not Detected   Test Information: Comment       Assessment & Plan:   Problem List Items Addressed This Visit     GAD (generalized anxiety disorder)   Relevant Medications   traZODone (DESYREL) 50 MG tablet   citalopram (CELEXA) 40 MG tablet   busPIRone (BUSPAR) 10 MG tablet   OCD (obsessive compulsive disorder) - Primary   Relevant Medications   traZODone (DESYREL) 50 MG tablet   citalopram (CELEXA) 40 MG tablet   busPIRone (BUSPAR) 10 MG tablet   Other Visit Diagnoses     Psychophysiological insomnia           Anxiety worsening GAD Underlying OCD Worsening now with panic and anxiety Worse after partial hysterectomy question hormonal component Past failed wellbutrin Past on Sertraline  Improved on Celexa '40mg'$  daily needs re order but not effective enough for her now. Will add on Buspar 5-'10mg'$  TID PRN, for more anxiety management, discussed benefits dosing risks side effects, future can adjust if needed Recommend future mental health referral to Psychiatry to manage further if unsuccessful with this approach Consider other SSRI vs SNRI options next if indicated.  Already on Trazodone for sleep / w insomnia and hot flashes.   Meds ordered this encounter  Medications   citalopram (CELEXA) 40 MG tablet    Sig: Take 1 tablet (40 mg total) by mouth daily.    Dispense:  90 tablet    Refill:  3   busPIRone (BUSPAR) 10 MG tablet    Sig: Take 1 tablet (10 mg total) by mouth 3 (three) times daily as needed. May start with half tablet dosing for '5mg'$  per dose. Increase if needed.    Dispense:  90 tablet    Refill:  2      Follow up plan: Return if symptoms worsen or fail to improve.   Patient verbalizes understanding with the above medical recommendations including the limitation of remote medical advice.  Specific follow-up and call-back criteria  were given for patient to follow-up or seek medical care more urgently if needed.  Total duration of direct patient care provided via video conference: 15 minutes   [name], [facility] 09/06/2021, 3:59 PM

## 2021-10-04 ENCOUNTER — Ambulatory Visit
Admission: RE | Admit: 2021-10-04 | Discharge: 2021-10-04 | Disposition: A | Discharge status: Home / Self Care | Payer: 59 | Source: Ambulatory Visit | Attending: Family Medicine | Admitting: Family Medicine

## 2021-10-04 DIAGNOSIS — Z1231 Encounter for screening mammogram for malignant neoplasm of breast: Secondary | ICD-10-CM

## 2021-11-08 IMAGING — MG MM DIGITAL SCREENING BILAT W/ TOMO AND CAD
8 series · 8 of 24 positions shown · non-contrast
Comparison: Previous exam(s).

CLINICAL DATA: Screening.

EXAM:
DIGITAL SCREENING BILATERAL MAMMOGRAM WITH TOMOSYNTHESIS AND CAD
TECHNIQUE: Bilateral screening digital craniocaudal and mediolateral oblique
mammograms were obtained. Bilateral screening digital breast
tomosynthesis was performed. The images were evaluated with
computer-aided detection.

[R MLO synth-2D]
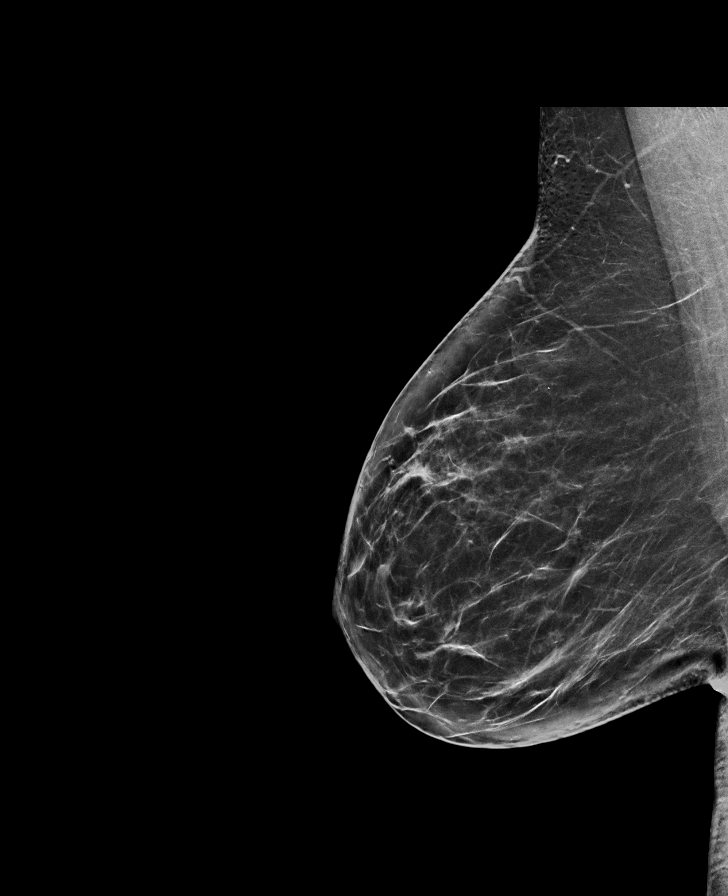

[R CC synth-2D]
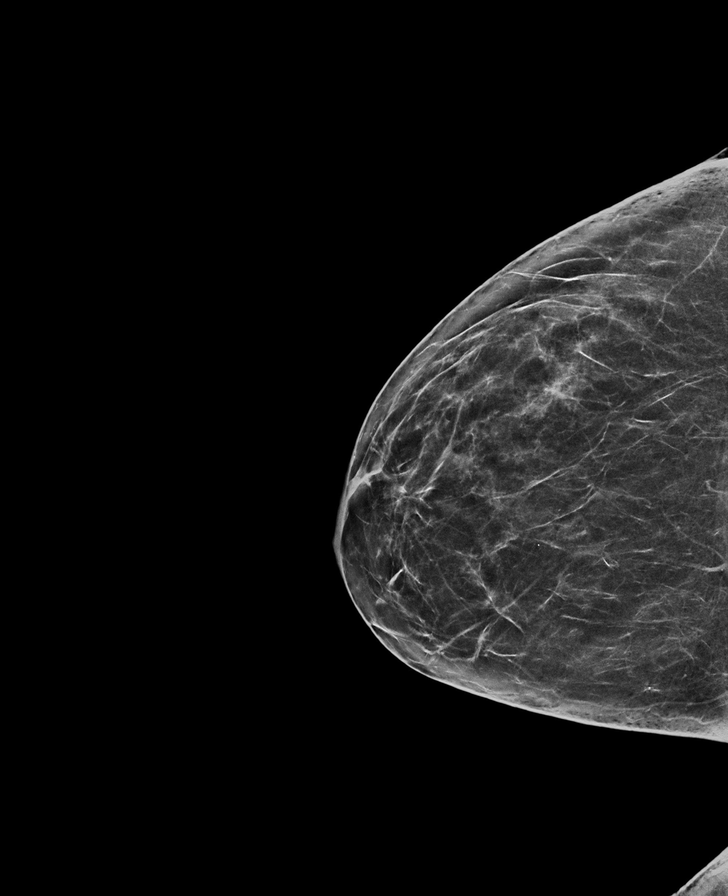

[L MLO synth-2D]
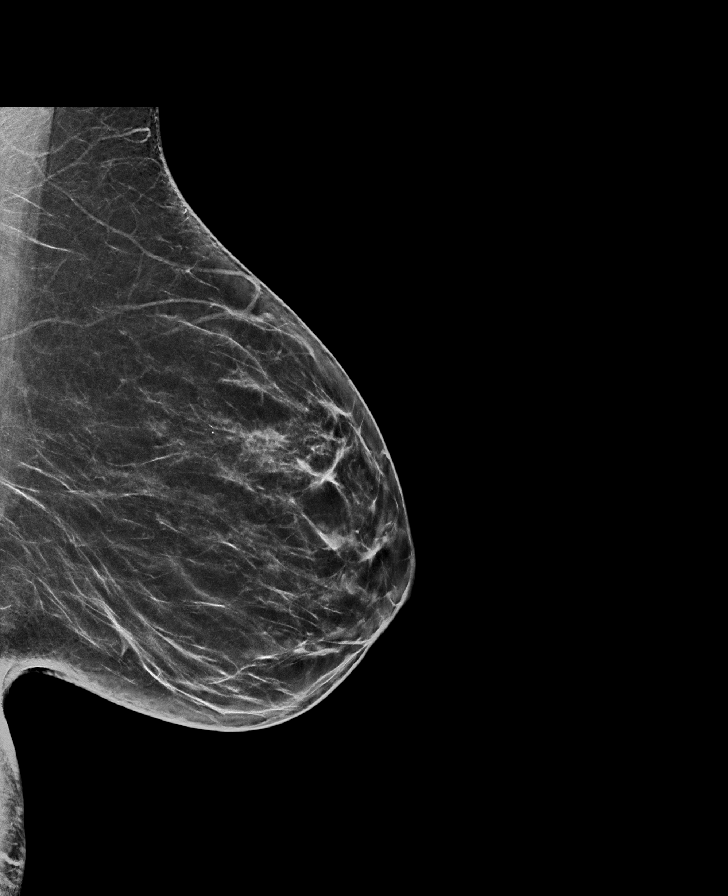

[L CC synth-2D]
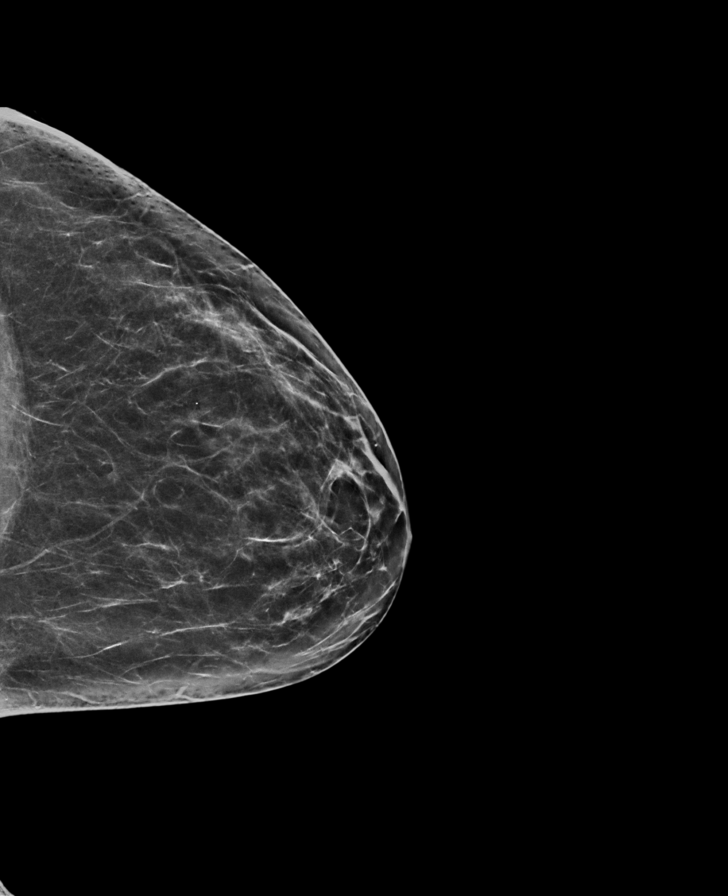

[R MLO tomo · tomo slice 36/71.0]
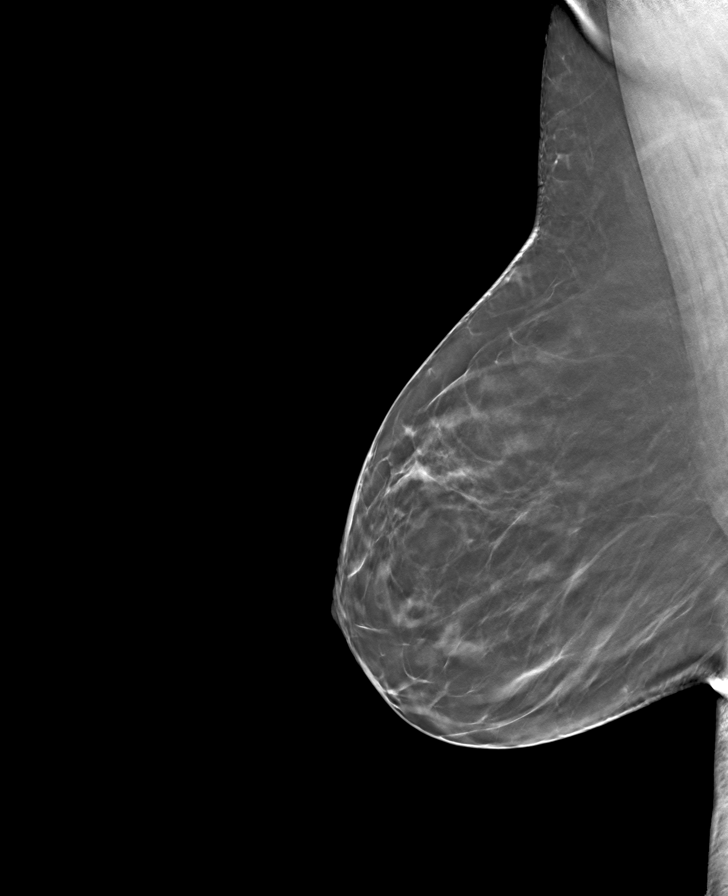

[L CC tomo · tomo slice 34/67.0]
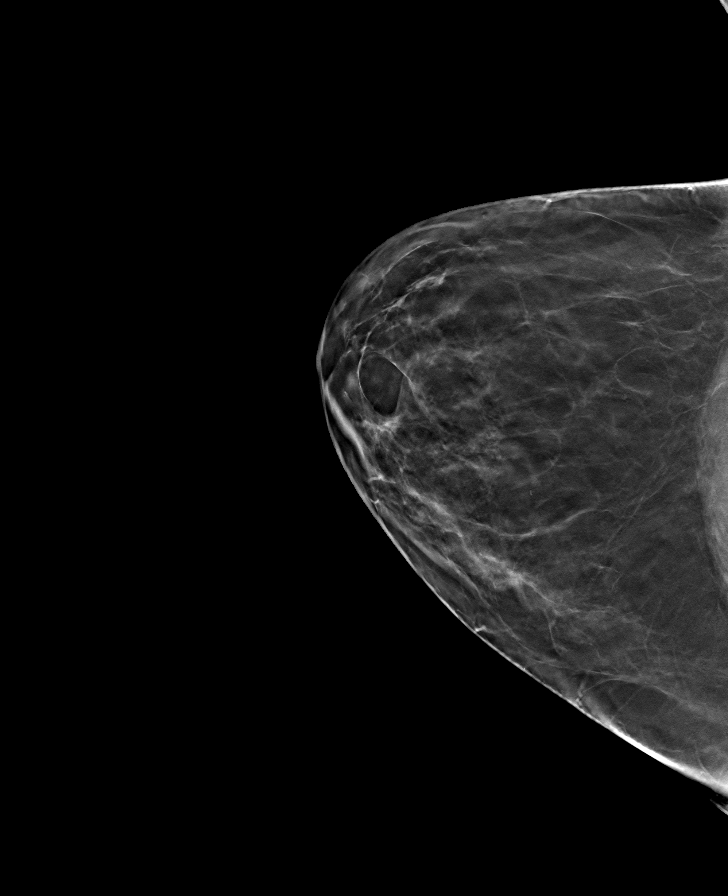

[L MLO tomo · tomo slice 35/68.0]
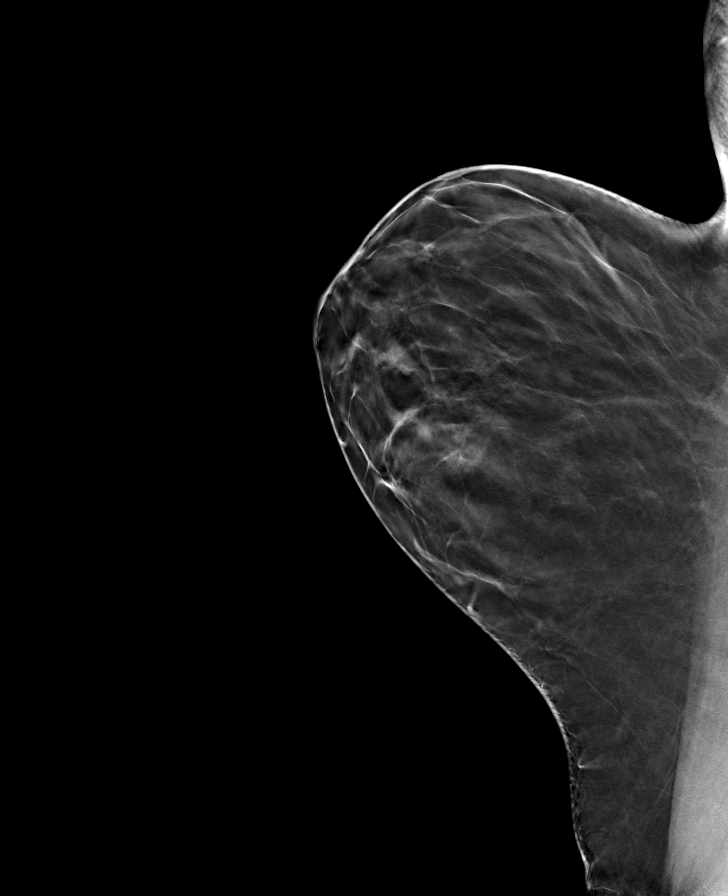

[R CC tomo · tomo slice 33/65.0]
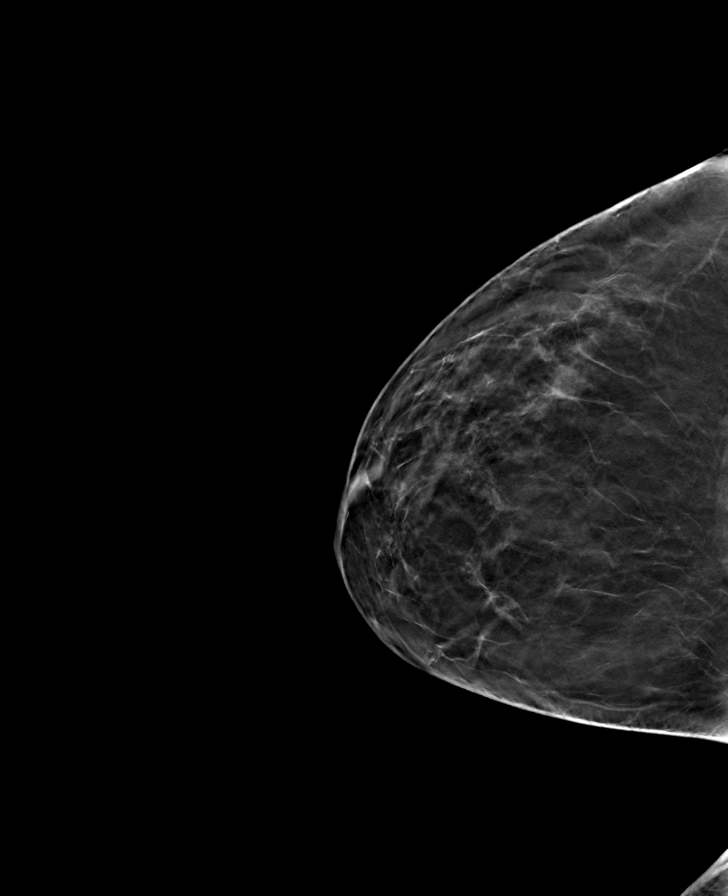

[8 of 24 positions shown; findings below may reference images not displayed]

ACR Breast Density Category b: There are scattered areas of
fibroglandular density.
FINDINGS: There are no findings suspicious for malignancy.
IMPRESSION: No mammographic evidence of malignancy. A result letter of this
screening mammogram will be mailed directly to the patient.

RECOMMENDATION:
Screening mammogram in one year. (Code:51-O-LD2)

BI-RADS CATEGORY  1: Negative.

## 2021-12-26 ENCOUNTER — Encounter: Payer: Self-pay | Admitting: Family Medicine

## 2021-12-26 ENCOUNTER — Ambulatory Visit (INDEPENDENT_AMBULATORY_CARE_PROVIDER_SITE_OTHER): Payer: 59 | Admitting: Family Medicine

## 2021-12-26 VITALS — BP 112/62 | HR 99 | Ht 63.0 in | Wt 156.0 lb

## 2021-12-26 DIAGNOSIS — U071 COVID-19: Secondary | ICD-10-CM | POA: Diagnosis not present

## 2021-12-26 DIAGNOSIS — F428 Other obsessive-compulsive disorder: Secondary | ICD-10-CM | POA: Diagnosis not present

## 2021-12-26 MED ORDER — CITALOPRAM HYDROBROMIDE 40 MG PO TABS: 40.0000 mg | ORAL_TABLET | Freq: Every day | ORAL | 3 refills | Status: DC

## 2021-12-26 MED ORDER — HYDROCOD POLI-CHLORPHE POLI ER 10-8 MG/5ML PO SUER: 5.0000 mL | Freq: Two times a day (BID) | ORAL | 0 refills | Status: DC | PRN

## 2021-12-26 MED ORDER — NIRMATRELVIR/RITONAVIR (PAXLOVID)TABLET: 3.0000 | ORAL_TABLET | Freq: Two times a day (BID) | ORAL | 0 refills | Status: AC

## 2021-12-26 NOTE — Progress Notes (Signed)
Subjective:    Patient ID: Gloria Harrison, female    DOB: 1965-10-19, 56 y.o.   MRN: [id number]  Gloria Harrison is a 56 y.o. female presenting on 12/26/2021 for Cough and Nasal Congestion  Patient presents for a same day appointment.   HPI  COVID-19 Infection New onset symptoms past 24-48 hours with viral syndrome, cough congestion mild fever, she took covid test but said she did the test wrong but it did come back positive she was unsure of result. Has not been on other medication. Out of work yesterday and today. Denies wheezing dyspnea  Initial COVID vaccine series documented.     08/10/2020    9:30 AM 11/12/2018    4:02 PM 04/29/2018    8:51 AM  Depression screen PHQ 2/9  Decreased Interest 1 0 1  Down, Depressed, Hopeless 1 0 2  PHQ - 2 Score 2 0 3  Altered sleeping 2 0 3  Tired, decreased energy '1 1 2  '$ Change in appetite 0 0 2  Feeling bad or failure about yourself  1 0 2  Trouble concentrating 1 0 0  Moving slowly or fidgety/restless 0 0 0  Suicidal thoughts 0 0 0  PHQ-9 Score '7 1 12  '$ Difficult doing work/chores Somewhat difficult Not difficult at all Somewhat difficult    Social History   Tobacco Use   Smoking status: Never   Smokeless tobacco: Never  Vaping Use   Vaping Use: Never used  Substance Use Topics   Alcohol use: No    Alcohol/week: 0.0 standard drinks of alcohol   Drug use: No    Review of Systems Per HPI unless specifically indicated above     Objective:    BP 112/62   Pulse 99   Ht '5\' 3"'$  (1.6 m)   Wt 156 lb (70.8 kg)   LMP 08/29/2020 (Approximate)   SpO2 98%   BMI 27.63 kg/m   Wt Readings from Last 3 Encounters:  12/26/21 156 lb (70.8 kg)  09/06/21 155 lb (70.3 kg)  01/08/21 155 lb 3.2 oz (70.4 kg)    Physical Exam Vitals and nursing note reviewed.  Constitutional:      General: She is not in acute distress.    Appearance: Normal appearance. She is well-developed. She is not diaphoretic.     Comments: Well-appearing,  comfortable, cooperative  HENT:     Head: Normocephalic and atraumatic.  Eyes:     General:        Right eye: No discharge.        Left eye: No discharge.     Conjunctiva/sclera: Conjunctivae normal.  Cardiovascular:     Rate and Rhythm: Normal rate.  Pulmonary:     Effort: Pulmonary effort is normal.  Skin:    General: Skin is warm and dry.     Findings: No erythema or rash.  Neurological:     Mental Status: She is alert and oriented to person, place, and time.  Psychiatric:        Mood and Affect: Mood normal.        Behavior: Behavior normal.        Thought Content: Thought content normal.     Comments: Well groomed, good eye contact, normal speech and thoughts    Results for orders placed or performed in visit on 01/08/21  COVID-19, Flu A+B and RSV  Result Value Ref Range   SARS-CoV-2, NAA Detected (A) Not Detected   Influenza A, NAA Not Detected  Not Detected   Influenza B, NAA Not Detected Not Detected   RSV, NAA Not Detected Not Detected   Test Information: Comment       Assessment & Plan:   Problem List Items Addressed This Visit     OCD (obsessive compulsive disorder)   Relevant Medications   citalopram (CELEXA) 40 MG tablet   Other Visit Diagnoses     COVID-19 virus infection    -  Primary   Relevant Medications   nirmatrelvir/ritonavir EUA (PAXLOVID) 20 x 150 MG & 10 x '100MG'$  TABS   chlorpheniramine-HYDROcodone (TUSSIONEX) 10-8 MG/5ML        COVID19 positive Symptom 1st onset 12/25/21 Confirm test positive 12/26/21 here today in office Mild to moderate symptoms currently. No red flags or dyspnea Risk factor age 33+   Start Paxlovid, GFR >60, 5 day course as prescribed, regular dosing with normal kidney function GFR >60. Reviewed med interaction checker discussed estrogen patch may be reduced, and her trazodone buspar may be slightly increased Counseling provided Supportive care OTC AS NEEDED Woprk note given Follow-up criteria given.   Meds  ordered this encounter  Medications   nirmatrelvir/ritonavir EUA (PAXLOVID) 20 x 150 MG & 10 x '100MG'$  TABS    Sig: Take 3 tablets by mouth 2 (two) times daily for 5 days. (Take nirmatrelvir 150 mg two tablets twice daily for 5 days and ritonavir 100 mg one tablet twice daily for 5 days) Patient GFR is >60    Dispense:  30 tablet    Refill:  0   chlorpheniramine-HYDROcodone (TUSSIONEX) 10-8 MG/5ML    Sig: Take 5 mLs by mouth every 12 (twelve) hours as needed for cough.    Dispense:  115 mL    Refill:  0   citalopram (CELEXA) 40 MG tablet    Sig: Take 1 tablet (40 mg total) by mouth daily.    Dispense:  90 tablet    Refill:  3      Follow up plan: Return if symptoms worsen or fail to improve.    [name], [facility] 12/26/2021, 9:51 AM

## 2021-12-26 NOTE — Patient Instructions (Addendum)
Thank you for coming to the office today.  Start Paxlovid for 5 days  Cough syrup ordered  Citalopram sent to [place]  COVID-19 COVID-19, or coronavirus disease 2019, is an infection that is caused by a new (novel) coronavirus called SARS-CoV-2. COVID-19 can cause many symptoms. In some people, the virus may not cause any symptoms. In others, it may cause mild or severe symptoms. Some people with severe infection develop severe disease. What are the causes? This illness is caused by a virus. The virus may be in the air as tiny specks of fluid (aerosols) or droplets, or it may be on surfaces. You may catch the virus by: Breathing in droplets from an infected person. Droplets can be spread by a person breathing, speaking, singing, coughing, or sneezing. Touching something, like a table or a doorknob, that has virus on it (is contaminated) and then touching your mouth, nose, or eyes. What increases the risk? Risk for infection: You are more likely to get infected with the COVID-19 virus if: You are within 6 ft (1.8 m) of a person with COVID-19 for 15 minutes or longer. You are providing care for a person who is infected with COVID-19. You are in close personal contact with other people. Close personal contact includes hugging, kissing, or sharing eating or drinking utensils. Risk for serious illness caused by COVID-19: You are more likely to get seriously ill from the COVID-19 virus if: You have cancer. You have a long-term (chronic) disease, such as: Chronic lung disease. This includes pulmonary embolism, chronic obstructive pulmonary disease, and cystic fibrosis. Long-term disease that lowers your body's ability to fight infection (immunocompromise). Serious cardiac conditions, such as heart failure, coronary artery disease, or cardiomyopathy. Diabetes. Chronic kidney disease. Liver diseases. These include cirrhosis, nonalcoholic fatty liver disease, alcoholic liver disease, or  autoimmune hepatitis. You have obesity. You are pregnant or were recently pregnant. You have sickle cell disease. What are the signs or symptoms? Symptoms of this condition can range from mild to severe. Symptoms may appear any time from 2 to 14 days after being exposed to the virus. They include: Fever or chills. Shortness of breath or trouble breathing. Feeling tired or very tired. Headaches, body aches, or muscle aches. Runny or stuffy nose, sneezing, coughing, or sore throat. New loss of taste or smell. This is rare. Some people may also have stomach problems, such as nausea, vomiting, or diarrhea. Other people may not have any symptoms of COVID-19. How is this diagnosed? This condition may be diagnosed by testing samples to check for the COVID-19 virus. The most common tests are the PCR test and the antigen test. Tests may be done in the lab or at home. They include: Using a swab to take a sample of fluid from the back of your nose and throat (nasopharyngeal fluid), from your nose, or from your throat. Testing a sample of saliva from your mouth. Testing a sample of coughed-up mucus from your lungs (sputum). How is this treated? Treatment for COVID-19 infection depends on the severity of the condition. Mild symptoms can be managed at home with rest, fluids, and over-the-counter medicines. Serious symptoms may be treated in a hospital intensive care unit (ICU). Treatment in the ICU may include: Supplemental oxygen. Extra oxygen is given through a tube in the nose, a face mask, or a hood. Medicines. These may include: Antivirals, such as monoclonal antibodies. These help your body fight off certain viruses that can cause disease. Anti-inflammatories, such as corticosteroids. These reduce  inflammation and suppress the immune system. Antithrombotics. These prevent or treat blood clots, if they develop. Convalescent plasma. This helps boost your immune system, if you have an underlying  immunosuppressive condition or are getting immunosuppressive treatments. Prone positioning. This means you will lie on your stomach. This helps oxygen to get into your lungs. Infection control measures. If you are at risk for more serious illness caused by COVID-19, your health care provider may prescribe two long-acting monoclonal antibodies, given together every 6 months. How is this prevented? To protect yourself: Use preventive medicine (pre-exposure prophylaxis). You may get pre-exposure prophylaxis if you have moderate or severe immunocompromise. Get vaccinated. Anyone 59 months old or older who meets guidelines can get a COVID-19 vaccine or vaccine series. This includes people who are pregnant or making breast milk (lactating). Get an added dose of COVID-19 vaccine after your first vaccine or vaccine series if you have moderate to severe immunocompromise. This applies if you have had a solid organ transplant or have been diagnosed with an immunocompromising condition. You should get the added dose 4 weeks after you got the first COVID-19 vaccine or vaccine series. If you get an mRNA vaccine, you will need a 3-dose primary series. If you get the J&J/Janssen vaccine, you will need a 2-dose primary series, with the second dose being an mRNA vaccine. Talk to your health care provider about getting experimental monoclonal antibodies. This treatment is approved under emergency use authorization to prevent severe illness before or after being exposed to the COVID-19 virus. You may be given monoclonal antibodies if: You have moderate or severe immunocompromise. This includes treatments that lower your immune response. People with immunocompromise may not develop protection against COVID-19 when they are vaccinated. You cannot be vaccinated. You may not get a vaccine if you have a severe allergic reaction to the vaccine or its components. You are not fully vaccinated. You are in a facility where  COVID-19 is present and: Are in close contact with a person who is infected with the COVID-19 virus. Are at high risk of being exposed to the COVID-19 virus. You are at risk of illness from new variants of the COVID-19 virus. To protect others: If you have symptoms of COVID-19, take steps to prevent the virus from spreading to others. Stay home. Leave your house only to get medical care. Do not use public transit, if possible. Do not travel while you are sick. Wash your hands often with soap and water for at least 20 seconds. If soap and water are not available, use alcohol-based hand sanitizer. Make sure that all people in your household wash their hands well and often. Cough or sneeze into a tissue or your sleeve or elbow. Do not cough or sneeze into your hand or into the air. Where to find more information Centers for Disease Control and Prevention: CharmCourses.be World Health Organization: https://www.castaneda.info/ Get help right away if: You have trouble breathing. You have pain or pressure in your chest. You are confused. You have bluish lips and fingernails. You have trouble waking from sleep. You have symptoms that get worse. These symptoms may be an emergency. Get help right away. Call 911. Do not wait to see if the symptoms will go away. Do not drive yourself to the hospital. Summary COVID-19 is an infection that is caused by a new coronavirus. Sometimes, there are no symptoms. Other times, symptoms range from mild to severe. Some people with a severe COVID-19 infection develop severe disease. The virus that causes  COVID-19 can spread from person to person through droplets or aerosols from breathing, speaking, singing, coughing, or sneezing. Mild symptoms of COVID-19 can be managed at home with rest, fluids, and over-the-counter medicines. This information is not intended to replace advice given to you by your health care provider. Make sure you discuss any  questions you have with your health care provider. Document Revised: 01/01/2021 Document Reviewed: 01/03/2021 Elsevier Patient Education  [place].   Please schedule a Follow-up Appointment to: Return if symptoms worsen or fail to improve.  If you have any other questions or concerns, please feel free to call the office or send a message through [place]. You may also schedule an earlier appointment if necessary.  Additionally, you may be receiving a survey about your experience at our office within a few days to 1 week by e-mail or mail. We value your feedback.  [name], DO [place]

## 2021-12-27 LAB — POC COVID19 BINAXNOW: SARS Coronavirus 2 Ag: POSITIVE — AB

## 2021-12-27 NOTE — Addendum Note (Signed)
Addended by: Orinda Kenner on: 12/27/2021 10:17 AM   Modules accepted: Orders

## 2022-09-04 ENCOUNTER — Other Ambulatory Visit: Payer: Self-pay | Admitting: Obstetrics and Gynecology

## 2022-09-04 DIAGNOSIS — Z1231 Encounter for screening mammogram for malignant neoplasm of breast: Secondary | ICD-10-CM

## 2022-10-31 ENCOUNTER — Ambulatory Visit
Admission: RE | Admit: 2022-10-31 | Discharge: 2022-10-31 | Disposition: A | Discharge status: Home / Self Care | Payer: 59 | Source: Ambulatory Visit | Attending: Obstetrics and Gynecology | Admitting: Obstetrics and Gynecology

## 2022-10-31 DIAGNOSIS — Z1231 Encounter for screening mammogram for malignant neoplasm of breast: Secondary | ICD-10-CM | POA: Insufficient documentation

## 2022-11-04 ENCOUNTER — Encounter: Payer: Self-pay | Admitting: Obstetrics and Gynecology

## 2022-11-06 ENCOUNTER — Other Ambulatory Visit: Payer: Self-pay | Admitting: Obstetrics and Gynecology

## 2022-11-06 DIAGNOSIS — R928 Other abnormal and inconclusive findings on diagnostic imaging of breast: Secondary | ICD-10-CM

## 2022-11-21 ENCOUNTER — Ambulatory Visit
Admission: RE | Admit: 2022-11-21 | Discharge: 2022-11-21 | Disposition: A | Discharge status: Home / Self Care | Payer: 59 | Source: Ambulatory Visit | Attending: Obstetrics and Gynecology | Admitting: Obstetrics and Gynecology

## 2022-11-21 DIAGNOSIS — R928 Other abnormal and inconclusive findings on diagnostic imaging of breast: Secondary | ICD-10-CM | POA: Diagnosis present

## 2022-11-28 ENCOUNTER — Encounter: Payer: Self-pay | Admitting: Family Medicine

## 2022-11-28 ENCOUNTER — Ambulatory Visit: Payer: 59 | Admitting: Family Medicine

## 2022-11-28 VITALS — BP 110/70 | HR 85 | Ht 62.0 in | Wt 145.6 lb

## 2022-11-28 DIAGNOSIS — K219 Gastro-esophageal reflux disease without esophagitis: Secondary | ICD-10-CM

## 2022-11-28 DIAGNOSIS — F428 Other obsessive-compulsive disorder: Secondary | ICD-10-CM | POA: Diagnosis not present

## 2022-11-28 DIAGNOSIS — Z23 Encounter for immunization: Secondary | ICD-10-CM

## 2022-11-28 DIAGNOSIS — F411 Generalized anxiety disorder: Secondary | ICD-10-CM

## 2022-11-28 DIAGNOSIS — Z1211 Encounter for screening for malignant neoplasm of colon: Secondary | ICD-10-CM

## 2022-11-28 DIAGNOSIS — F5104 Psychophysiologic insomnia: Secondary | ICD-10-CM

## 2022-11-28 DIAGNOSIS — E782 Mixed hyperlipidemia: Secondary | ICD-10-CM

## 2022-11-28 DIAGNOSIS — Z8249 Family history of ischemic heart disease and other diseases of the circulatory system: Secondary | ICD-10-CM

## 2022-11-28 MED ORDER — TRAZODONE HCL 50 MG PO TABS
50.0000 mg | ORAL_TABLET | Freq: Every day | ORAL | 3 refills | Status: DC
Start: 2022-11-28 — End: 2023-10-02

## 2022-11-28 MED ORDER — PANTOPRAZOLE SODIUM 40 MG PO TBEC
40.0000 mg | DELAYED_RELEASE_TABLET | Freq: Every day | ORAL | 3 refills | Status: DC
Start: 2022-11-28 — End: 2023-12-10

## 2022-11-28 MED ORDER — CITALOPRAM HYDROBROMIDE 40 MG PO TABS
40.0000 mg | ORAL_TABLET | Freq: Every day | ORAL | 0 refills | Status: DC
Start: 2022-11-28 — End: 2023-01-09

## 2022-11-28 NOTE — Patient Instructions (Addendum)
Thank you for coming to the office today.  Flu Shot + TDap tetanus today  Please check with insurance / pharmacy about cost coverage of Shingrix shingles vaccine 2 doses 2-6 months apart  Refilled meds today  The citalopram is 40mg , 30 pills, let me know if need more or adjustment on the dose.  Referral to GI for Colonoscopy Due in 02/2023  [facility]) [address] [place], [place] [address] Main: [phone]   You have been referred for a Coronary Calcium Score Cardiac CT Scan. This is a screening test for patients aged 75-50+ with cardiovascular risk factors or who are healthy but would be interested in Cardiovascular Screening for heart disease. Even if there is a family history of heart disease, this imaging can be useful. Typically it can be done every 5+ years or at a different timeline we agree on  The scan will look at the chest and mainly focus on the heart and identify early signs of calcium build up or blockages within the heart arteries. It is not 100% accurate for identifying blockages or heart disease, but it is useful to help [place] predict who may have some early changes or be at risk in the future for a heart attack or cardiovascular problem.  The results are reviewed by a Cardiologist and they will document the results. It should become available on MyChart. Typically the results are divided into percentiles based on other patients of the same demographic and age. So it will compare your risk to others similar to you. If you have a higher score >99 or higher percentile >75%tile, it is recommended to consider Statin cholesterol therapy and or referral to Cardiologist. I will try to help explain your results and if we have questions we can contact the Cardiologist.  You will be contacted for scheduling. Usually it is done at any imaging facility through [facility], [facility] or [facility].  The cost is $99 flat  fee total and it does not go through insurance, so no authorization is required.   Please schedule a Follow-up Appointment to: Return if symptoms worsen or fail to improve.  If you have any other questions or concerns, please feel free to call the office or send a message through MyChart. You may also schedule an earlier appointment if necessary.  Additionally, you may be receiving a survey about your experience at our office within a few days to 1 week by e-mail or mail. We value your feedback.  Saralyn Pilar, DO [facility], [place]

## 2022-11-28 NOTE — Progress Notes (Signed)
Subjective:    Patient ID: Gloria Harrison, female    DOB: 1965/06/02, 57 y.o.   MRN: [id number]  Gloria Harrison is a 57 y.o. female presenting on 11/28/2022 for Gastroesophageal Reflux, GYN updates, Vaccines, Insomnia   HPI  Discussed the use of AI scribe software for clinical note transcription with the patient, who gave verbal consent to proceed.     The patient, with a history of partial hysterectomy-induced menopause, OCD, and elevated cholesterol, presents for a routine medication checkup and vaccination. She reports no new health concerns and has recently undergone a mammogram and gynecological examination by her GYN.  The patient's current medication regimen includes pantoprazole 40mg  daily, citalopram 40mg  daily, and trazodone 50mg  nightly. She reports a need for the trazodone to maintain sleep throughout the night. The patient is also on a compounded hormone replacement therapy cream, prescribed by her gynecologist, to manage menopause symptoms.  The patient's GYN is reportedly attempting to transition her from citalopram to desvenlafaxine (Pristiq) due to the onset of menopause causing a decrease in the effectiveness of citalopram. The patient is currently going to work with GYN to transition the medication.    Health Maintenance:  Flu Vaccine + TDap today  Check cost on Shingrix at pharmacy  Colonoscopy last 2020 AGI. Next due 02/2023. Request order  Followed by Dr Antony Haste OBGYN Mammo screening [facility], abnormal, sent for Diagnostics US and negative.     11/28/2022   10:59 AM 08/10/2020    9:30 AM 11/12/2018    4:02 PM  Depression screen PHQ 2/9  Decreased Interest 0 1 0  Down, Depressed, Hopeless 0 1 0  PHQ - 2 Score 0 2 0  Altered sleeping 1 2 0  Tired, decreased energy 0 1 1  Change in appetite 0 0 0  Feeling bad or failure about yourself  0 1 0  Trouble concentrating 0 1 0  Moving slowly or fidgety/restless 0 0 0  Suicidal thoughts 0 0 0  PHQ-9 Score 1 7 1    Difficult doing work/chores Not difficult at all Somewhat difficult Not difficult at all    Social History   Tobacco Use   Smoking status: Never   Smokeless tobacco: Never  Vaping Use   Vaping status: Never Used  Substance Use Topics   Alcohol use: No    Alcohol/week: 0.0 standard drinks of alcohol   Drug use: No    Review of Systems Per HPI unless specifically indicated above     Objective:    BP 110/70   Pulse 85   Ht 5\' 2"  (1.575 m)   Wt 145 lb 9.6 oz (66 kg)   LMP 08/29/2020 (Approximate)   SpO2 98%   BMI 26.63 kg/m   Wt Readings from Last 3 Encounters:  11/28/22 145 lb 9.6 oz (66 kg)  12/26/21 156 lb (70.8 kg)  09/06/21 155 lb (70.3 kg)    Physical Exam Vitals and nursing note reviewed.  Constitutional:      General: She is not in acute distress.    Appearance: She is well-developed. She is not diaphoretic.     Comments: Well-appearing, comfortable, cooperative  HENT:     Head: Normocephalic and atraumatic.  Eyes:     General:        Right eye: No discharge.        Left eye: No discharge.     Conjunctiva/sclera: Conjunctivae normal.  Neck:     Thyroid: No thyromegaly.  Cardiovascular:  Rate and Rhythm: Normal rate and regular rhythm.     Heart sounds: Normal heart sounds. No murmur heard. Pulmonary:     Effort: Pulmonary effort is normal. No respiratory distress.     Breath sounds: Normal breath sounds. No wheezing or rales.  Musculoskeletal:        General: Normal range of motion.     Cervical back: Normal range of motion and neck supple.  Lymphadenopathy:     Cervical: No cervical adenopathy.  Skin:    General: Skin is warm and dry.     Findings: No erythema or rash.  Neurological:     Mental Status: She is alert and oriented to person, place, and time.  Psychiatric:        Behavior: Behavior normal.     Comments: Well groomed, good eye contact, normal speech and thoughts    Results for orders placed or performed in visit on 12/26/21   POC COVID-19  Result Value Ref Range   SARS Coronavirus 2 Ag Positive (A) Negative      Assessment & Plan:   Problem List Items Addressed This Visit     GAD (generalized anxiety disorder)   Relevant Medications   desvenlafaxine (PRISTIQ) 50 MG 24 hr tablet   citalopram (CELEXA) 40 MG tablet   traZODone (DESYREL) 50 MG tablet   Hyperlipidemia   Relevant Orders   CT CARDIAC SCORING (SELF PAY ONLY)   OCD (obsessive compulsive disorder) - Primary   Relevant Medications   citalopram (CELEXA) 40 MG tablet   Other Visit Diagnoses     Gastroesophageal reflux disease       Relevant Medications   pantoprazole (PROTONIX) 40 MG tablet   Other Relevant Orders   Tdap vaccine greater than or equal to 7yo IM (Completed)   Needs flu shot       Relevant Orders   Flu vaccine trivalent PF, 6mos and older(Flulaval,Afluria,Fluarix,Fluzone) (Completed)   Need for diphtheria-tetanus-pertussis (Tdap) vaccine       Relevant Orders   Tdap vaccine greater than or equal to 7yo IM (Completed)   Psychophysiological insomnia       Relevant Medications   traZODone (DESYREL) 50 MG tablet   Family history of heart disease       Relevant Orders   CT CARDIAC SCORING (SELF PAY ONLY)       Followed by GYN Dr Vincente Poli History of Breast Cyst on diagnostic US  Anxiety and Depression Currently on Citalopram 40mg  daily. GYN is considering transitioning to Desvenlafaxine (Pristiq). -Continue Citalopram 40mg  daily for now. Re order 30 day supply. Notify office if need lower 20mg  or taper instructions  Insomnia Trazodone 50mg  nightly is effective. -Refill Trazodone 50mg  for 90 days.  Gastroesophageal Reflux Disease (GERD) Controlled on Pantoprazole 40mg  daily. -Refill Pantoprazole 40mg  for 90 days.  Vaccinations -Administer Influenza and Tetanus vaccines today. -Check insurance coverage for Shingrix vaccine and consider getting it at the pharmacy if more cost-effective.  Cardiovascular Risk Family  history of heart disease and elevated cholesterol. -Order coronary calcium score for cardiovascular risk assessment.  Colon Cancer Screening Due in February 2025. -Send referral to [facility] for colonoscopy.  Follow-up As needed or in one year.        Orders Placed This Encounter  Procedures   CT CARDIAC SCORING (SELF PAY ONLY)    Standing Status:   Future    Standing Expiration Date:   11/28/2023    Order Specific Question:   Is patient pregnant?  Answer:   No    Order Specific Question:   Preferred imaging location?    Answer:   [facility]   Tdap vaccine greater than or equal to 7yo IM   Flu vaccine trivalent PF, 6mos and older(Flulaval,Afluria,Fluarix,Fluzone)   Ambulatory referral to Gastroenterology    Referral Priority:   Routine    Referral Type:   Consultation    Referral Reason:   Specialty Services Required    Number of Visits Requested:   1     Meds ordered this encounter  Medications   pantoprazole (PROTONIX) 40 MG tablet    Sig: Take 1 tablet (40 mg total) by mouth daily before breakfast.    Dispense:  90 tablet    Refill:  3   citalopram (CELEXA) 40 MG tablet    Sig: Take 1 tablet (40 mg total) by mouth daily.    Dispense:  30 tablet    Refill:  0   traZODone (DESYREL) 50 MG tablet    Sig: Take 1 tablet (50 mg total) by mouth at bedtime.    Dispense:  90 tablet    Refill:  3    Follow up plan: Return if symptoms worsen or fail to improve.   Saralyn Pilar, DO [facility] 11/28/2022, 11:14 AM

## 2022-12-05 ENCOUNTER — Other Ambulatory Visit: Payer: 59

## 2022-12-08 ENCOUNTER — Encounter: Payer: Self-pay | Admitting: *Deleted

## 2023-01-09 ENCOUNTER — Other Ambulatory Visit: Payer: Self-pay | Admitting: Family Medicine

## 2023-01-09 DIAGNOSIS — F411 Generalized anxiety disorder: Secondary | ICD-10-CM

## 2023-01-09 DIAGNOSIS — F428 Other obsessive-compulsive disorder: Secondary | ICD-10-CM

## 2023-01-09 NOTE — Telephone Encounter (Signed)
Called patient to confirm. Still on Citalopram 40mg . She has apt with GYN 01/13/23 who is tapering her off and switch to Pristiq.  She request 30 more pills of citalopram 40mg , she can cut in half if need to taper or notify us for 20mg   Saralyn Pilar, DO [facility] 01/09/2023, 5:31 PM

## 2023-01-09 NOTE — Telephone Encounter (Signed)
Requested medication (s) are due for refill today: Yes  Requested medication (s) are on the active medication list: Yes  Last refill:  11/28/22  Future visit scheduled: No  Notes to clinic:  unsure if refill is appropriate per last OV note.     Requested Prescriptions  Pending Prescriptions Disp Refills   citalopram (CELEXA) 40 MG tablet [Pharmacy Med Name: Citalopram Hydrobromide 40 MG Oral Tablet] 30 tablet 0    Sig: Take 1 tablet by mouth once daily     Psychiatry:  Antidepressants - SSRI Passed - 01/09/2023 12:05 PM      Passed - Completed PHQ-2 or PHQ-9 in the last 360 days      Passed - Valid encounter within last 6 months    Recent Outpatient Visits           1 month ago Other obsessive-compulsive disorders   [place] [facility] Smitty Cords, DO   1 year ago COVID-19 virus infection   [place] [facility] [place], Netta Neat, DO   1 year ago Other obsessive-compulsive disorders   [place] [facility] Smitty Cords, DO   2 years ago Acute viral syndrome   [place] [facility] Smitty Cords, DO   2 years ago Vasomotor flushing   [place] [facility] [place], Netta Neat, [place]

## 2023-02-09 ENCOUNTER — Other Ambulatory Visit: Payer: Self-pay | Admitting: Family Medicine

## 2023-02-09 DIAGNOSIS — F411 Generalized anxiety disorder: Secondary | ICD-10-CM

## 2023-02-09 DIAGNOSIS — F428 Other obsessive-compulsive disorder: Secondary | ICD-10-CM

## 2023-02-10 NOTE — Telephone Encounter (Signed)
 Requested Prescriptions  Pending Prescriptions Disp Refills   citalopram  (CELEXA ) 40 MG tablet [Pharmacy Med Name: Citalopram  Hydrobromide 40 MG Oral Tablet] 90 tablet 0    Sig: Take 1 tablet by mouth once daily     Psychiatry:  Antidepressants - SSRI Passed - 02/10/2023  1:29 PM      Passed - Completed PHQ-2 or PHQ-9 in the last 360 days      Passed - Valid encounter within last 6 months    Recent Outpatient Visits           2 months ago Other obsessive-compulsive disorders   [place] [facility] [place] Marsa PARAS, DO   1 year ago COVID-19 virus infection   [place] [facility] [place], Marsa PARAS, DO   1 year ago Other obsessive-compulsive disorders   [place] [facility] [place] Marsa PARAS, DO   2 years ago Acute viral syndrome   [place] [facility] [place] Marsa PARAS, DO   2 years ago Vasomotor flushing   [place] [facility] [place], Marsa PARAS, [place]

## 2023-03-21 ENCOUNTER — Emergency Department
Admission: EM | Admit: 2023-03-21 | Discharge: 2023-03-21 | Discharge status: Against Medical Advice | Payer: 59 | Attending: Emergency Medicine | Admitting: Emergency Medicine

## 2023-03-21 ENCOUNTER — Other Ambulatory Visit: Payer: Self-pay

## 2023-03-21 DIAGNOSIS — R197 Diarrhea, unspecified: Secondary | ICD-10-CM | POA: Diagnosis not present

## 2023-03-21 DIAGNOSIS — M549 Dorsalgia, unspecified: Secondary | ICD-10-CM | POA: Insufficient documentation

## 2023-03-21 DIAGNOSIS — Z5321 Procedure and treatment not carried out due to patient leaving prior to being seen by health care provider: Secondary | ICD-10-CM | POA: Diagnosis not present

## 2023-03-21 DIAGNOSIS — R109 Unspecified abdominal pain: Secondary | ICD-10-CM | POA: Insufficient documentation

## 2023-03-21 NOTE — ED Notes (Signed)
 Pt stating she is going to leave because her condition is not an emergency and she cannot afford to stay. Pt is not upset at all with ER staff but states she was pressured by [facility] staff to come to ER. States she will see her own doc on Monday. Pt is in no acute distress, pt has unlabored respirations, dry skin.

## 2023-03-21 NOTE — ED Triage Notes (Signed)
 Pt to ED for diarrhea since 1 month (2-3 times daily). Some "soreness" across abdomen, bilateral. Has been taking pepto bismol and ibuprofen since 1 week and since then noticed stools turned black.  Had some mid abdominal pain and back pain that woke pt up last night. Went to [facility] and they sent here. No urinary symptoms.   Pt stating she was just trying to get rx for abx and did not realize that pepto bismol could turn stools black. Pt stating she felt pressured by [facility] staff to come to ED and does not want to stay or pay the copay. Stating will probably leave and go see her doctor on Monday. She thought [facility] would have done stool sample. States they made her sign something she could "die" if did not come to ER.

## 2023-04-17 ENCOUNTER — Ambulatory Visit: Admitting: Family Medicine

## 2023-04-17 ENCOUNTER — Encounter: Payer: Self-pay | Admitting: Family Medicine

## 2023-04-17 VITALS — BP 110/70 | HR 84 | Ht 62.0 in | Wt 145.0 lb

## 2023-04-17 DIAGNOSIS — G629 Polyneuropathy, unspecified: Secondary | ICD-10-CM

## 2023-04-17 DIAGNOSIS — R7309 Other abnormal glucose: Secondary | ICD-10-CM

## 2023-04-17 DIAGNOSIS — F428 Other obsessive-compulsive disorder: Secondary | ICD-10-CM

## 2023-04-17 DIAGNOSIS — E538 Deficiency of other specified B group vitamins: Secondary | ICD-10-CM

## 2023-04-17 DIAGNOSIS — E559 Vitamin D deficiency, unspecified: Secondary | ICD-10-CM

## 2023-04-17 DIAGNOSIS — E782 Mixed hyperlipidemia: Secondary | ICD-10-CM

## 2023-04-17 DIAGNOSIS — F5104 Psychophysiologic insomnia: Secondary | ICD-10-CM

## 2023-04-17 DIAGNOSIS — F411 Generalized anxiety disorder: Secondary | ICD-10-CM

## 2023-04-17 MED ORDER — CITALOPRAM HYDROBROMIDE 10 MG PO TABS
10.0000 mg | ORAL_TABLET | Freq: Every day | ORAL | 0 refills | Status: DC
Start: 2023-04-17 — End: 2023-08-07

## 2023-04-17 NOTE — Patient Instructions (Addendum)
 Thank you for coming to the office today.  Labs today  Likely callus or other structural issue related  Keep up with Dr Excell Seltzer for further eval  Recommend Alpha Lipoic Acid for nerve health supplement 600mg  up to 3 times a day.  Taper the Citalopram further but slower now. Go from 20mg  daily (half of 40) eventually to 10mg  whole tab. Then eventually half = 5mg  daily  Please schedule a Follow-up Appointment to: Return if symptoms worsen or fail to improve.  If you have any other questions or concerns, please feel free to call the office or send a message through MyChart. You may also schedule an earlier appointment if necessary.  Additionally, you may be receiving a survey about your experience at our office within a few days to 1 week by e-mail or mail. We value your feedback.  Saralyn Pilar, DO [facility] [place], [place]

## 2023-04-17 NOTE — Progress Notes (Signed)
 Subjective:    Patient ID: Gloria Harrison, female    DOB: [birth year], 58 y.o.   MRN: [id number]  Gloria Harrison is a 58 y.o. female presenting on 04/17/2023 for Peripheral Neuropathy   HPI   Discussed the use of AI scribe software for clinical note transcription with the patient, who gave verbal consent to proceed.  History of Present Illness   Gloria Harrison is a 58 year old female who presents with tingling and burning pain in both feet.  She experiences tingling and burning sensations in both feet, with the left foot being more affected than the right. The pain is located at the ball of her feet and feels like 'pins and needles' or 'prickling.' This issue has been present for about a week, and she has difficulty keeping her feet warm, even using a heating pad without relief.  Upcoming apt with Dr Carollee Leitz Podiatry 05/01/23  She has been using metatarsal pads in her shoes, which alleviate the pain completely, but the pain returns when she stops using them. She notes that she sits a lot at her job, which may contribute to her symptoms.  She is currently on hormone replacement therapy due to menopause and has been transitioning from citalopram to Pristiq for OCD and anxiety. She has been taking 20 mg of citalopram after tapering from 40 mg, but experienced irritability when attempting to discontinue it completely. She is also taking a progesterone pill and a hormone replacement cream.  Her mother suffers from gout, which affects her feet and legs, but she does not believe her symptoms are similar, as her pain is not isolated to specific areas and is not accompanied by redness or swelling. No isolated redness or swelling in the affected areas.           04/17/2023   10:21 AM 04/17/2023   10:20 AM 11/28/2022   10:59 AM  Depression screen PHQ 2/9  Decreased Interest 0 0 0  Down, Depressed, Hopeless 0 0 0  PHQ - 2 Score 0 0 0  Altered sleeping 2  1  Tired, decreased energy 2  0   Change in appetite 0  0  Feeling bad or failure about yourself  0  0  Trouble concentrating 1  0  Moving slowly or fidgety/restless 0  0  Suicidal thoughts 0  0  PHQ-9 Score 5  1  Difficult doing work/chores Somewhat difficult  Not difficult at all       04/17/2023   10:21 AM 11/28/2022   10:59 AM 08/10/2020    9:31 AM 11/12/2018    4:02 PM  GAD 7 : Generalized Anxiety Score  Nervous, Anxious, on Edge 1 0 3 1  Control/stop worrying 2 0 2 1  Worry too much - different things 2 0 2 1  Trouble relaxing 2 0 2 1  Restless 2 0 2 0  Easily annoyed or irritable 3 0 2 0  Afraid - awful might happen 2 0 2 0  Total GAD 7 Score 14 0 15 4  Anxiety Difficulty Somewhat difficult   Not difficult at all    Social History   Tobacco Use   Smoking status: Never   Smokeless tobacco: Never  Vaping Use   Vaping status: Never Used  Substance Use Topics   Alcohol use: No    Alcohol/week: 0.0 standard drinks of alcohol   Drug use: No    Review of Systems Per HPI unless specifically indicated above  Objective:    BP 110/70 (BP Location: Right Arm, Patient Position: Sitting)   Pulse 84   Ht 5\' 2"  (1.575 m)   Wt 145 lb (65.8 kg)   LMP 08/29/2020 (Approximate)   SpO2 97%   BMI 26.52 kg/m   Wt Readings from Last 3 Encounters:  04/17/23 145 lb (65.8 kg)  11/28/22 145 lb 9.6 oz (66 kg)  12/26/21 156 lb (70.8 kg)    Physical Exam Vitals and nursing note reviewed.  Constitutional:      General: She is not in acute distress.    Appearance: Normal appearance. She is well-developed. She is not diaphoretic.     Comments: Well-appearing, comfortable, cooperative  HENT:     Head: Normocephalic and atraumatic.  Eyes:     General:        Right eye: No discharge.        Left eye: No discharge.     Conjunctiva/sclera: Conjunctivae normal.  Cardiovascular:     Rate and Rhythm: Normal rate.  Pulmonary:     Effort: Pulmonary effort is normal.  Skin:    General: Skin is warm and dry.      Findings: No erythema or rash.     Comments: Bilateral Foot exam with intact monofilament sensation. Mild callus formation L>R forefoot, no other obvious deformity  Neurological:     Mental Status: She is alert and oriented to person, place, and time.  Psychiatric:        Mood and Affect: Mood normal.        Behavior: Behavior normal.        Thought Content: Thought content normal.     Comments: Well groomed, good eye contact, normal speech and thoughts     Results for orders placed or performed in visit on 12/26/21  POC COVID-19   Collection Time: 12/27/21 10:16 AM  Result Value Ref Range   SARS Coronavirus 2 Ag Positive (A) Negative      Assessment & Plan:   Problem List Items Addressed This Visit     GAD (generalized anxiety disorder)   Relevant Medications   citalopram (CELEXA) 10 MG tablet   Hyperlipidemia   Relevant Orders   TSH   Lipid panel   OCD (obsessive compulsive disorder)   Relevant Medications   citalopram (CELEXA) 10 MG tablet   Vitamin D deficiency   Relevant Orders   VITAMIN D 25 Hydroxy (Vit-D Deficiency, Fractures)   Other Visit Diagnoses       Peripheral polyneuropathy    -  Primary   Relevant Medications   citalopram (CELEXA) 10 MG tablet   Other Relevant Orders   COMPLETE METABOLIC PANEL WITH GFR   CBC with Differential/Platelet     B12 deficiency       Relevant Orders   Vitamin B12     Psychophysiological insomnia         Abnormal glucose       Relevant Orders   Hemoglobin A1c        Peripheral Neuropathy Bilateral foot tingling, burning, and pain suggest peripheral neuropathy.  Differential includes diabetic neuropathy, vitamin B12 deficiency, and circulatory issues.   Likely Suspect Structural causes considered due to relief with metatarsal pads. Podiatrist evaluation pending, scheduled already w/ Gavin Potters Dr Excell Seltzer in April 2025 - Order CBC, glucose screening, vitamin B12, and vitamin D levels. - Continue metatarsal  pads.  Post Menopausal Symptoms Significant menopausal symptoms with anxiety and sleep disturbances. Transitioning from citalopram to Pristiq under gynecologist's  guidance. Transition challenging with increased irritability as she is reducing citalopram.  Pristiq well-studied for menopausal symptoms. - Continue hormone replacement therapy.  Suspect she is not tolerating going from Citalopram 20mg  down to 0 abruptly, I advised new rx  Prescribe citalopram 10 mg for slower taper, then to half = 5mg  at some point - Continue Pristiq as prescribed. - Consider alpha lipoic acid for nerve health.  General Health Maintenance Routine blood work due. Emphasized regular monitoring in context of symptoms and menopause management. - Order full blood panel including vitamin D and B12 levels.        Orders Placed This Encounter  Procedures   TSH   Lipid panel    Has the patient fasted?:   Yes   Hemoglobin A1c   COMPLETE METABOLIC PANEL WITH GFR   CBC with Differential/Platelet   Vitamin B12   VITAMIN D 25 Hydroxy (Vit-D Deficiency, Fractures)    Meds ordered this encounter  Medications   citalopram (CELEXA) 10 MG tablet    Sig: Take 1 tablet (10 mg total) by mouth daily.    Dispense:  30 tablet    Refill:  0    Follow up plan: Return if symptoms worsen or fail to improve.   Saralyn Pilar, DO [facility] 04/17/2023, 9:53 AM

## 2023-04-18 LAB — CBC WITH DIFFERENTIAL/PLATELET
Absolute Lymphocytes: 1170 {cells}/uL (ref 850–3900)
Absolute Monocytes: 395 {cells}/uL (ref 200–950)
Basophils Absolute: 42 {cells}/uL (ref 0–200)
Basophils Relative: 0.8 %
Eosinophils Absolute: 52 {cells}/uL (ref 15–500)
Eosinophils Relative: 1 %
HCT: 43.1 % (ref 35.0–45.0)
Hemoglobin: 14.5 g/dL (ref 11.7–15.5)
MCH: 29.3 pg (ref 27.0–33.0)
MCHC: 33.6 g/dL (ref 32.0–36.0)
MCV: 87.1 fL (ref 80.0–100.0)
MPV: 10.1 fL (ref 7.5–12.5)
Monocytes Relative: 7.6 %
Neutro Abs: 3541 {cells}/uL (ref 1500–7800)
Neutrophils Relative %: 68.1 %
Platelets: 279 10*3/uL (ref 140–400)
RBC: 4.95 10*6/uL (ref 3.80–5.10)
RDW: 13.2 % (ref 11.0–15.0)
Total Lymphocyte: 22.5 %
WBC: 5.2 10*3/uL (ref 3.8–10.8)

## 2023-04-18 LAB — COMPLETE METABOLIC PANEL WITH GFR
AG Ratio: 1.7 (calc) (ref 1.0–2.5)
ALT: 9 U/L (ref 6–29)
AST: 11 U/L (ref 10–35)
Albumin: 4.3 g/dL (ref 3.6–5.1)
Alkaline phosphatase (APISO): 58 U/L (ref 37–153)
BUN: 16 mg/dL (ref 7–25)
CO2: 29 mmol/L (ref 20–32)
Calcium: 9.6 mg/dL (ref 8.6–10.4)
Chloride: 104 mmol/L (ref 98–110)
Creat: 0.81 mg/dL (ref 0.50–1.03)
Globulin: 2.6 g/dL (ref 1.9–3.7)
Glucose, Bld: 108 mg/dL — ABNORMAL HIGH (ref 65–99)
Potassium: 4.2 mmol/L (ref 3.5–5.3)
Sodium: 139 mmol/L (ref 135–146)
Total Bilirubin: 0.5 mg/dL (ref 0.2–1.2)
Total Protein: 6.9 g/dL (ref 6.1–8.1)

## 2023-04-18 LAB — VITAMIN B12: Vitamin B-12: 633 pg/mL (ref 200–1100)

## 2023-04-18 LAB — HEMOGLOBIN A1C
Hgb A1c MFr Bld: 5.8 %{Hb} — ABNORMAL HIGH (ref <5.7)
Mean Plasma Glucose: 120 mg/dL
eAG (mmol/L): 6.6 mmol/L

## 2023-04-18 LAB — LIPID PANEL
Cholesterol: 325 mg/dL — ABNORMAL HIGH (ref <200)
HDL: 52 mg/dL (ref >=50)
LDL Cholesterol (Calc): 238 mg/dL — ABNORMAL HIGH
Non-HDL Cholesterol (Calc): 273 mg/dL — ABNORMAL HIGH (ref <130)
Total CHOL/HDL Ratio: 6.3 (calc) — ABNORMAL HIGH (ref <5.0)
Triglycerides: 181 mg/dL — ABNORMAL HIGH (ref <150)

## 2023-04-18 LAB — VITAMIN D 25 HYDROXY (VIT D DEFICIENCY, FRACTURES): Vit D, 25-Hydroxy: 20 ng/mL — ABNORMAL LOW (ref 30–100)

## 2023-04-18 LAB — TSH: TSH: 0.86 m[IU]/L

## 2023-04-21 ENCOUNTER — Other Ambulatory Visit: Payer: Self-pay | Admitting: Family Medicine

## 2023-04-21 ENCOUNTER — Telehealth: Payer: Self-pay

## 2023-04-21 DIAGNOSIS — E7801 Familial hypercholesterolemia: Secondary | ICD-10-CM

## 2023-04-21 MED ORDER — ROSUVASTATIN CALCIUM 20 MG PO TABS
20.0000 mg | ORAL_TABLET | Freq: Every day | ORAL | 3 refills | Status: DC
Start: 2023-04-21 — End: 2023-05-25

## 2023-04-21 NOTE — Telephone Encounter (Signed)
 Notified patient that her vitamin d has been stable for the past 5 years. After talking she is not concerned about taking a vitamin d supplement

## 2023-04-21 NOTE — Telephone Encounter (Signed)
 Copied from CRM [id number]. Topic: Clinical - Lab/Test Results >> Apr 21, 2023 10:59 AM Fuller Mandril wrote: Reason for CRM: Patient called stated he results from her labs are in and she has some concerns. Would like to know what provider suggestions and if he is going to prescribe anything. No notes yet from provider. Pt would like a call back with an update. Stats she attempted to send message on MyChart but it did not go through. Thank You

## 2023-04-21 NOTE — Telephone Encounter (Signed)
 Please schedule her for fasting lab repeat in 3 months. Orders are in.  I called patient today to discuss her lab results. 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 See result note for more details  I sent new rx Statin Rosuvastatin 20mg  to [facility], 90 day, and refills.  Also re ordered CT Coronary Calcium Score scan.  Saralyn Pilar, DO [facility] 04/21/2023, 12:57 PM

## 2023-04-21 NOTE — Telephone Encounter (Signed)
 Her vitamin D has remained stable 20-21 for the past 5 years on the lab. Not a significant concern. Yes she may take Vitamin D3 supplement if she prefers, it is optional.  Start OTC Vitamin D3 5,000 iu daily for 12 weeks then reduce to OTC Vitamin D3 2,000 iu daily for maintenance  Saralyn Pilar, DO [facility] 04/21/2023, 3:25 PM

## 2023-04-24 ENCOUNTER — Encounter: Payer: Self-pay | Admitting: Family Medicine

## 2023-04-24 ENCOUNTER — Ambulatory Visit
Admission: RE | Admit: 2023-04-24 | Discharge: 2023-04-24 | Disposition: A | Discharge status: Home / Self Care | Payer: Self-pay | Source: Ambulatory Visit | Attending: Family Medicine | Admitting: Family Medicine

## 2023-04-24 DIAGNOSIS — E7801 Familial hypercholesterolemia: Secondary | ICD-10-CM | POA: Insufficient documentation

## 2023-05-01 ENCOUNTER — Ambulatory Visit: Payer: Self-pay

## 2023-05-01 NOTE — Telephone Encounter (Signed)
 Patient notified of all instructions, she will pause the statin until no symptoms. She will then start taking 1/2 a pill daily.

## 2023-05-01 NOTE — Telephone Encounter (Signed)
  Chief Complaint: medication question  Symptoms: body aches Frequency: nightly   Disposition: [] ED /[] Urgent Care (no appt availability in office) / [] Appointment(In office/virtual)/ []  [place]/ [] Home Care/ [] Refused Recommended Disposition /[]  Mobile Bus/ [x]  Follow-up with PCP Additional Notes: Pt calling with concerns of body aches since starting Crestor. Pt states her back and hips nightly and has to take Tylenol for some relief. Pt states the body aches are worse any time she lies down. Pt is asking  if dosage can be reduced or if medication can be changed. No appt was made. Please advise.              Copied from CRM [id number]. Topic: Clinical - Medication Question >> May 01, 2023 11:54 AM Louie Casa B wrote: Reason for CRM: patient wants to know if she can get another rx in place of citalopram (CELEXA) 10 MG tablet Reason for Disposition  [1] Caller has NON-URGENT medicine question about med that PCP prescribed AND [2] triager unable to answer question  Answer Assessment - Initial Assessment Questions 1. NAME of MEDICINE: "What medicine(s) are you calling about?"     Crestor 2. QUESTION: "What is your question?" (e.g., double dose of medicine, side effect) Can pt take lower dose or change medication  3. PRESCRIBER: "Who prescribed the medicine?" Reason: if prescribed by specialist, call should be referred to that group.     [facility]  4. SYMPTOMS: "Do you have any symptoms?" If Yes, ask: "What symptoms are you having?"  "How bad are the symptoms (e.g., mild, moderate, severe)     Back andhips aching all night 5. PREGNANCY:  "Is there any chance that you are pregnant?" "When was your last menstrual period?"     *No Answer*  Protocols used: Medication Question Call-A-AH

## 2023-05-01 NOTE — Telephone Encounter (Signed)
 Please notify patient it is okay to try one of the following options:  She should PAUSE the Statin completely for 1-2 weeks until symptoms subside  Then re-try at lower dose  1) Take HALF tab nightly instead. This would be dose 10mg  instead of whole 20mg .  OR  2) Take whole tab every other day.  OR  3) Take HALF tab every other day  If still issues we can change the type of Statin.  Saralyn Pilar, DO [facility] 05/01/2023, 1:51 PM

## 2023-05-25 ENCOUNTER — Encounter: Payer: Self-pay | Admitting: Family Medicine

## 2023-05-25 ENCOUNTER — Telehealth: Payer: Self-pay

## 2023-05-25 DIAGNOSIS — E7801 Familial hypercholesterolemia: Secondary | ICD-10-CM

## 2023-05-25 MED ORDER — PRAVASTATIN SODIUM 10 MG PO TABS
10.0000 mg | ORAL_TABLET | Freq: Every day | ORAL | 1 refills | Status: DC
Start: 2023-05-25 — End: 2023-11-10

## 2023-05-25 NOTE — Telephone Encounter (Signed)
 Understood. Yes we can switch to alternative version. Pravastatin is an option that may work. I will send new order 90 day order Pravastatin 10mg . She can start once her symptoms have resolved. She should remain off of Rosuvastatin  for at least 2 weeks to make sure side effects resolved first before starting Pravastatin. She can even start Pravastatin twice a week at first and gradually increase frequency to every night if she wants to try gradually introducing it.  Domingo Friend, DO [facility] 05/25/2023, 1:37 PM

## 2023-05-25 NOTE — Telephone Encounter (Signed)
 I attempted to call patient. Did not reach her. I sent a detailed mychart message. Copied below.    Some patients unfortunately cannot tolerate these medications at a certain dose.   Here are the options.   Pause Statin for 2 weeks until muscle aches go away completely.   Then new rx. Reduce to Rosuvastatin  5mg  dose, instead of 20mg . Can take it twice a week only and gradually increase to every day if tolerated.   Or we can switch to a different version Pravastatin tends to be a good alternative that may be tolerated well.   Lastly we can consider ordering the injectable Repatha every 2 week medicine.   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 So that would be the best treatment - with the injectable medicine. However it may be higher cost or not covered.   You would likely need to check into cost and coverage for that one, and often we can use copay coupon through the company website. We should be able to make it work if you like.   The injectable medication has 0 side effects. It does not cause muscle aches.   Let me know what you think! If it was up to me I would consider trying the Repatha injection if you want to check coverage first then let me know. If that is not an option for you then we can try the lower dose Rosuvastatin  next

## 2023-05-25 NOTE — Telephone Encounter (Signed)
 Copied from CRM [id number]. Topic: Clinical - Medication Question >> May 25, 2023  9:12 AM Lizabeth Riggs wrote: Reason for CRM:  She still is having muscle aches and wants to try another medication instead of rosuvastatin  (CRESTOR ) 20 MG tablet. She has been taking a lower dose per doctor's request and still has muscle aches all over Please message her through MyChart or give her a call.

## 2023-05-25 NOTE — Addendum Note (Signed)
 Addended by: Raina Bunting on: 05/25/2023 01:37 PM   Modules accepted: Orders

## 2023-05-25 NOTE — Telephone Encounter (Signed)
 Copied from CRM [id number]. Topic: Clinical - Medication Question >> May 25, 2023 12:21 PM Carlatta H wrote: Reason for CRM: Patient called and stated she would like to try the we can switch to a different version Pravastatin tends to be a good alternative that may be tolerated well.//Please call to advised when to pick up medication

## 2023-07-06 ENCOUNTER — Telehealth: Payer: Self-pay

## 2023-07-06 NOTE — Telephone Encounter (Signed)
 Copied from CRM [id number]. Topic: General - Other >> Jul 06, 2023 12:38 PM Turkey B wrote: Reason for CRM: pt called in, wanting to speak with Dr Linnell Richardson nurse about what med she should be taking for OCD. She states her med was changed and wants to give some info on this

## 2023-07-24 ENCOUNTER — Other Ambulatory Visit

## 2023-07-24 DIAGNOSIS — E7801 Familial hypercholesterolemia: Secondary | ICD-10-CM

## 2023-08-07 ENCOUNTER — Encounter: Payer: Self-pay | Admitting: Family Medicine

## 2023-08-07 ENCOUNTER — Ambulatory Visit: Admitting: Family Medicine

## 2023-08-07 VITALS — BP 110/70 | HR 107 | Ht 62.0 in | Wt 146.2 lb

## 2023-08-07 DIAGNOSIS — F900 Attention-deficit hyperactivity disorder, predominantly inattentive type: Secondary | ICD-10-CM

## 2023-08-07 DIAGNOSIS — F5104 Psychophysiologic insomnia: Secondary | ICD-10-CM | POA: Insufficient documentation

## 2023-08-07 DIAGNOSIS — F428 Other obsessive-compulsive disorder: Secondary | ICD-10-CM

## 2023-08-07 DIAGNOSIS — F411 Generalized anxiety disorder: Secondary | ICD-10-CM | POA: Diagnosis not present

## 2023-08-07 DIAGNOSIS — G4700 Insomnia, unspecified: Secondary | ICD-10-CM | POA: Insufficient documentation

## 2023-08-07 DIAGNOSIS — F4310 Post-traumatic stress disorder, unspecified: Secondary | ICD-10-CM | POA: Diagnosis not present

## 2023-08-07 NOTE — Patient Instructions (Addendum)
 Thank you for coming to the office today.  Referral today, stay tuned for update.  [facility] [address] [place], [place] [address] [phone]  Please schedule a Follow-up Appointment to: Return if symptoms worsen or fail to improve.  If you have any other questions or concerns, please feel free to call the office or send a message through MyChart. You may also schedule an earlier appointment if necessary.  Additionally, you may be receiving a survey about your experience at our office within a few days to 1 week by e-mail or mail. We value your feedback.  Marsa Officer, DO [facility], [place]

## 2023-08-07 NOTE — Progress Notes (Unsigned)
 Subjective:    Patient ID: Gloria Harrison, female    DOB: 1965/06/03, 58 y.o.   MRN: [id number]  Gloria Harrison is a 58 y.o. female presenting on 08/07/2023 for Medical Management of Chronic Issues   HPI  Discussed the use of AI scribe software for clinical note transcription with the patient, who gave verbal consent to proceed.  History of Present Illness   Gloria Harrison is a 58 year old female who presents with concerns about medication management and side effects post-menopause.  Medication management and polypharmacy - On multiple medications and desires to streamline regimen - Feels some medications are causing side effects that require additional medications  Menopausal symptoms and hormone replacement therapy - Post-menopausal - Initiated hormone replacement therapy with Dr. Mat  OCD PTSD ADHD GAD Depression Insomnia  Longstanding psychiatric history. No recent psychiatric care, it has been managed by her GYN Dr Celesta Failed Pristiq, Citalopram  - Currently taking duloxetine, which is effective  Sedative use and sleep disturbance - Currently taking clonazepam and temazepam, but does not take them together due to sedative effects. Clonazepam is during day AS NEEDED anxiety only, and if not taking this she will consider taking Temazepam at night for insomnia - Desires to eventually sleep without medication  - Symptoms of attention-deficit/hyperactivity disorder, with overlap with obsessive-compulsive disorder symptoms - No recent psychiatric care; last psychiatric treatment approximately 30 years ago          08/07/2023    9:28 AM 04/17/2023   10:21 AM 04/17/2023   10:20 AM  Depression screen PHQ 2/9  Decreased Interest 2 0 0  Down, Depressed, Hopeless 2 0 0  PHQ - 2 Score 4 0 0  Altered sleeping 2 2   Tired, decreased energy 1 2   Change in appetite 2 0   Feeling bad or failure about yourself  2 0   Trouble concentrating 2 1   Moving slowly or  fidgety/restless 1 0   Suicidal thoughts 0 0   PHQ-9 Score 14 5   Difficult doing work/chores Somewhat difficult Somewhat difficult        08/07/2023    9:29 AM 04/17/2023   10:21 AM 11/28/2022   10:59 AM 08/10/2020    9:31 AM  GAD 7 : Generalized Anxiety Score  Nervous, Anxious, on Edge 3 1 0 3  Control/stop worrying 3 2 0 2  Worry too much - different things 3 2 0 2  Trouble relaxing 3 2 0 2  Restless 3 2 0 2  Easily annoyed or irritable 3 3 0 2  Afraid - awful might happen 3 2 0 2  Total GAD 7 Score 21 14 0 15  Anxiety Difficulty Somewhat difficult Somewhat difficult      Social History   Tobacco Use   Smoking status: Never   Smokeless tobacco: Never  Vaping Use   Vaping status: Never Used  Substance Use Topics   Alcohol use: No    Alcohol/week: 0.0 standard drinks of alcohol   Drug use: No    Review of Systems Per HPI unless specifically indicated above     Objective:    BP 110/70 (BP Location: Right Arm, Patient Position: Sitting, Cuff Size: Normal)   Pulse (!) 107   Ht 5' 2 (1.575 m)   Wt 146 lb 4 oz (66.3 kg)   LMP 08/29/2020 (Approximate)   SpO2 99%   BMI 26.75 kg/m   Wt Readings from Last 3 Encounters:  08/07/23 146 lb 4 oz (66.3 kg)  04/17/23 145 lb (65.8 kg)  11/28/22 145 lb 9.6 oz (66 kg)    Physical Exam Vitals and nursing note reviewed.  Constitutional:      General: She is not in acute distress.    Appearance: She is well-developed. She is not diaphoretic.     Comments: Well-appearing, comfortable, cooperative  HENT:     Head: Normocephalic and atraumatic.  Eyes:     General:        Right eye: No discharge.        Left eye: No discharge.     Conjunctiva/sclera: Conjunctivae normal.  Neck:     Thyroid : No thyromegaly.  Cardiovascular:     Rate and Rhythm: Normal rate and regular rhythm.     Heart sounds: Normal heart sounds. No murmur heard. Pulmonary:     Effort: Pulmonary effort is normal. No respiratory distress.     Breath  sounds: Normal breath sounds. No wheezing or rales.  Musculoskeletal:        General: Normal range of motion.     Cervical back: Normal range of motion and neck supple.  Lymphadenopathy:     Cervical: No cervical adenopathy.  Skin:    General: Skin is warm and dry.     Findings: No erythema or rash.  Neurological:     Mental Status: She is alert and oriented to person, place, and time.  Psychiatric:        Behavior: Behavior normal.     Comments: Well groomed, good eye contact, normal speech and thoughts     Results for orders placed or performed in visit on 04/17/23  TSH   Collection Time: 04/17/23 10:11 AM  Result Value Ref Range   TSH 0.86 mIU/L  Lipid panel   Collection Time: 04/17/23 10:11 AM  Result Value Ref Range   Cholesterol 325 (H) <200 mg/dL   HDL 52 > OR = 50 mg/dL   Triglycerides 818 (H) <150 mg/dL   LDL Cholesterol (Calc) 238 (H) mg/dL (calc)   Total CHOL/HDL Ratio 6.3 (H) <5.0 (calc)   Non-HDL Cholesterol (Calc) 273 (H) <130 mg/dL (calc)  Hemoglobin J8r   Collection Time: 04/17/23 10:11 AM  Result Value Ref Range   Hgb A1c MFr Bld 5.8 (H) <5.7 % of total Hgb   Mean Plasma Glucose 120 mg/dL   eAG (mmol/L) 6.6 mmol/L  COMPLETE METABOLIC PANEL WITH GFR   Collection Time: 04/17/23 10:11 AM  Result Value Ref Range   Glucose, Bld 108 (H) 65 - 99 mg/dL   BUN 16 7 - 25 mg/dL   Creat 9.18 9.49 - 8.96 mg/dL   BUN/Creatinine Ratio SEE NOTE: 6 - 22 (calc)   Sodium 139 135 - 146 mmol/L   Potassium 4.2 3.5 - 5.3 mmol/L   Chloride 104 98 - 110 mmol/L   CO2 29 20 - 32 mmol/L   Calcium  9.6 8.6 - 10.4 mg/dL   Total Protein 6.9 6.1 - 8.1 g/dL   Albumin 4.3 3.6 - 5.1 g/dL   Globulin 2.6 1.9 - 3.7 g/dL (calc)   AG Ratio 1.7 1.0 - 2.5 (calc)   Total Bilirubin 0.5 0.2 - 1.2 mg/dL   Alkaline phosphatase (APISO) 58 37 - 153 U/L   AST 11 10 - 35 U/L   ALT 9 6 - 29 U/L  CBC with Differential/Platelet   Collection Time: 04/17/23 10:11 AM  Result Value Ref Range   WBC  5.2 3.8 -  10.8 Thousand/uL   RBC 4.95 3.80 - 5.10 Million/uL   Hemoglobin 14.5 11.7 - 15.5 g/dL   HCT 56.8 64.9 - 54.9 %   MCV 87.1 80.0 - 100.0 fL   MCH 29.3 27.0 - 33.0 pg   MCHC 33.6 32.0 - 36.0 g/dL   RDW 86.7 88.9 - 84.9 %   Platelets 279 140 - 400 Thousand/uL   MPV 10.1 7.5 - 12.5 fL   Neutro Abs 3,541 1,500 - 7,800 cells/uL   Absolute Lymphocytes 1,170 850 - 3,900 cells/uL   Absolute Monocytes 395 200 - 950 cells/uL   Eosinophils Absolute 52 15 - 500 cells/uL   Basophils Absolute 42 0 - 200 cells/uL   Neutrophils Relative % 68.1 %   Total Lymphocyte 22.5 %   Monocytes Relative 7.6 %   Eosinophils Relative 1.0 %   Basophils Relative 0.8 %  Vitamin B12   Collection Time: 04/17/23 10:11 AM  Result Value Ref Range   Vitamin B-12 633 200 - 1,100 pg/mL  VITAMIN D  25 Hydroxy (Vit-D Deficiency, Fractures)   Collection Time: 04/17/23 10:11 AM  Result Value Ref Range   Vit D, 25-Hydroxy 20 (L) 30 - 100 ng/mL      Assessment & Plan:   Problem List Items Addressed This Visit     GAD (generalized anxiety disorder)   Relevant Medications   DULoxetine (CYMBALTA) 30 MG capsule   Other Relevant Orders   Ambulatory referral to Psychiatry   OCD (obsessive compulsive disorder) - Primary   Relevant Orders   Ambulatory referral to Psychiatry   Psychophysiological insomnia   Relevant Orders   Ambulatory referral to Psychiatry   PTSD (post-traumatic stress disorder)   Relevant Medications   DULoxetine (CYMBALTA) 30 MG capsule   Other Relevant Orders   Ambulatory referral to Psychiatry   Other Visit Diagnoses       Attention deficit hyperactivity disorder (ADHD), predominantly inattentive type       Relevant Orders   Ambulatory referral to Psychiatry        Medication Management She experienced challenges with her medication regimen post-menopause, concerned about side effects and medication reliance.  - Failed Citalopram , Pristiq  PREVIOUS Psych management done by OBGYN  Dr Mat - [facility]  - HRT and Medication management for mental health  Duloxetine effective. Aims to reduce sleep medication reliance and streamline regimen. - Refer to psychiatry for medication management and potential adjustment or tapering.  Obsessive-Compulsive Disorder (OCD) and Generalized Anxiety Disorder (GAD) OCD and GAD managed with current medications. Seeks psychiatric evaluation to optimize treatment and reduce medication reliance. - Include OCD and GAD in psychiatry referral for comprehensive management.  Post-Traumatic Stress Disorder (PTSD) In therapy for PTSD, seeks psychiatric support for comprehensive management with other mental health issues. - Include PTSD in psychiatry referral for comprehensive management.  Attention-Deficit/Hyperactivity Disorder (ADHD) Reports ADHD symptoms emerging with OCD and GAD. Seeks evaluation and management. - Include ADHD symptoms in psychiatry referral for comprehensive management.   [facility] [address] [place], [place] [address] [phone]  referral to Psychiatry for medication management and therapy for multiple diagnoses - OCD, PTSD, GAD, Depression, Insomnia. And some history of possible ADHD. On Duloxetine, Clonazepam,  Trazodone , Temazepam. She has done better on Duloxetine. She plans to be weaned or discontinued off of Temazepam and some of her medications   Requires Friday appointments due to schedule  She prefers female provider  Orders Placed This Encounter  Procedures   Ambulatory referral to Psychiatry    Referral Priority:   Routine    Referral Type:   Psychiatric    Referral Reason:   Specialty Services Required    Requested Specialty:   Psychiatry    Number of Visits Requested:   1    No orders of the defined types were placed in this encounter.   Follow up plan: Return if symptoms worsen or fail to  improve.  Marsa Officer, DO [facility] 08/07/2023, 9:44 AM

## 2023-08-17 ENCOUNTER — Telehealth: Payer: Self-pay

## 2023-08-17 NOTE — Telephone Encounter (Signed)
 Copied from CRM [id number]. Topic: Referral - Status >> Aug 17, 2023  1:15 PM Avram MATSU wrote: Reason for CRM: PT is calling because she saw her provider and they discuss getting a referral sent to physiatry. Pt wanted to know when it will be sent out.   [phone]    ----------------------------------------------------------------------- From previous Reason for Contact - Referral Request: Did the patient discuss referral with their provider in the last year?   (If No - schedule appointment) (If Yes - send message)  Appointment offered?    Type of order/referral and detailed reason for visit:   Preference of office, provider, location:   If referral order, have you been seen by this specialty before?   (If Yes, this issue or another issue? When? Where?  Can we respond through MyChart?

## 2023-09-29 ENCOUNTER — Ambulatory Visit: Admitting: Psychiatry

## 2023-09-29 ENCOUNTER — Encounter: Payer: Self-pay | Admitting: Psychiatry

## 2023-09-29 ENCOUNTER — Other Ambulatory Visit: Payer: Self-pay

## 2023-09-29 VITALS — BP 109/72 | HR 96 | Temp 97.5°F | Ht 62.0 in | Wt 149.2 lb

## 2023-09-29 DIAGNOSIS — Z9189 Other specified personal risk factors, not elsewhere classified: Secondary | ICD-10-CM | POA: Insufficient documentation

## 2023-09-29 DIAGNOSIS — G4700 Insomnia, unspecified: Secondary | ICD-10-CM

## 2023-09-29 DIAGNOSIS — F4310 Post-traumatic stress disorder, unspecified: Secondary | ICD-10-CM

## 2023-09-29 DIAGNOSIS — Z79899 Other long term (current) drug therapy: Secondary | ICD-10-CM | POA: Insufficient documentation

## 2023-09-29 DIAGNOSIS — F429 Obsessive-compulsive disorder, unspecified: Secondary | ICD-10-CM | POA: Diagnosis not present
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
## 2023-09-29 NOTE — Patient Instructions (Signed)
 Please call for EKG - [phone]  [email or web address]  [email or web address]  [email or web address] [facility] [address], [place], [place] [address]   [phone]  [facility] [email or web address] [address], [place], [place] [address]  [phone]  [facility], [facility] [email or web address] [address], [place], [place] [address]  [phone]   [facility] - [phone]  [facility] - [phone]  [facility] - [phone] counseling [phone]  [facility] - [phone]   [facility] [address] [place], [place] [address]  [phone]    Belvie Chancy [address] [place], [place] [address]  [phone]    [facility] [address] [place], [place] [address]  [phone]   Quetiapine  Tablets What is this medication? QUETIAPINE  (kwe TYE a peen) treats schizophrenia and bipolar disorder. It works by balancing the levels of dopamine and serotonin in your brain, hormones that help regulate mood, behaviors, and thoughts. It belongs to a group of medications called antipsychotics. Antipsychotic medications can be used to treat several kinds of mental health conditions. This medicine may be used for other purposes; ask your health care provider or pharmacist if you have questions. COMMON BRAND NAME(S): Seroquel  What should I tell my care team before I take this medication? They need to know if you have any of these conditions: Blockage in your bowels Cataracts Constipation Dementia Diabetes Difficulty swallowing Glaucoma Heart disease High levels of prolactin History of breast cancer History of irregular heartbeat Liver disease Low blood cell levels (white cells, red cells, and platelets) Low blood  pressure Parkinson disease Prostate disease Seizures Suicidal thoughts, plans, or attempt by you or a family member Thyroid  disease Trouble passing urine An unusual or allergic reaction to quetiapine , other medications, foods, dyes, or preservatives Pregnant or trying to get pregnant Breastfeeding How should I use this medication? Take this medication by mouth with water. Take it as directed on the prescription label at the same time every day. You can take it with or without food. If it upsets your stomach, take it with food. Keep taking it unless your care team tells you to stop. A special MedGuide will be given to you by the pharmacist with each prescription and refill. Be sure to read this information carefully each time. Talk to your care team about the use of this medication in children. While this medication may be prescribed for children as young as 10 years for selected conditions, precautions do apply. People over 56 years of age may have a stronger reaction to this medication and need smaller doses. Overdosage: If you think you have taken too much of this medicine contact a poison control center or emergency room at once. NOTE: This medicine is only for you. Do not share this medicine with others. What if I miss a dose? If you miss a dose, take it as soon as you can. If it is almost time for your next dose, take only that dose. Do not take double or extra doses. What may interact with this medication? Do not take this medication with any of the following: Cisapride Dronedarone Metoclopramide  Pimozide Thioridazine This medication may also interact with the following: Alcohol Antihistamines for allergy, cough, and cold Atropine Avasimibe Certain antivirals for HIV or hepatitis Certain medications for anxiety or sleep Certain medications for bladder problems,  such as oxybutynin, tolterodine Certain medications for depression, such as amitriptyline, fluoxetine, nefazodone,  sertraline  Certain medications for fungal infections, such as fluconazole , ketoconazole, itraconazole, posaconazole Certain medications for stomach problems, such as dicyclomine, hyoscyamine Certain medications for travel sickness, such as scopolamine Cimetidine General anesthetics, such as halothane, isoflurane, methoxyflurane, propofol Ipratropium Levodopa or other medications for Parkinson disease Medications for blood pressure Medications for seizures Medications that relax muscles for surgery Opioid medications for pain Other medications that cause heart rhythm changes Phenothiazines, such as chlorpromazine, prochlorperazine Rifampin St. John's wort This list may not describe all possible interactions. Give your health care provider a list of all the medicines, herbs, non-prescription drugs, or dietary supplements you use. Also tell them if you smoke, drink alcohol, or use illegal drugs. Some items may interact with your medicine. What should I watch for while using this medication? Visit your care team for regular checks on your progress. Tell your care team if your symptoms do not start to get better or if they get worse. Do not suddenly stop taking This medication. You may develop a severe reaction. Your care team will tell you how much medication to take. If your care team wants you to stop the medication, the dose may be slowly lowered over time to avoid any side effects. You may need to have an eye exam before and during use of this medication. This medication may increase blood sugar. Ask your care team if changes in diet or medications are needed if you have diabetes. This medication may cause thoughts of suicide or depression. This includes sudden changes in mood, behaviors, or thoughts. These changes can happen at any time but are more common in the beginning of treatment or after a change in dose. Call your care team right away if you experience these thoughts or worsening  depression. This medication may affect your coordination, reaction time, or judgment. Do not drive or operate machinery until you know how this medication affects you. Sit up or stand slowly to reduce the risk of dizzy or fainting spells. Drinking alcohol with this medication can increase the risk of these side effects. This medication can cause problems with controlling your body temperature. It can lower the response of your body to cold temperatures. If possible, stay indoors during cold weather. If you must go outdoors, wear warm clothes. It can also lower the response of your body to heat. Do not overheat. Do not over-exercise. Stay out of the sun when possible. If you must be in the sun, wear cool clothing. Drink plenty of water. If you have trouble controlling your body temperature, call your care team right away. What side effects may I notice from receiving this medication? Side effects that you should report to your care team as soon as possible: Allergic reactions--skin rash, itching, hives, swelling of the face, lips, tongue, or throat Heart rhythm changes--fast or irregular heartbeat, dizziness, feeling faint or lightheaded, chest pain, trouble breathing High blood sugar (hyperglycemia)--increased thirst or amount of urine, unusual weakness or fatigue, blurry vision High fever, stiff muscles, increased sweating, fast or irregular heartbeat, and confusion, which may be signs of neuroleptic malignant syndrome High prolactin level--unexpected breast tissue growth, discharge from the nipple, change in sex drive or performance, irregular menstrual cycle Increase in blood pressure in children Infection--fever, chills, cough, or sore throat Low blood pressure--dizziness, feeling faint or lightheaded, blurry vision Low thyroid  levels (hypothyroidism)--unusual weakness or fatigue, increased sensitivity to cold, constipation, hair loss, dry skin, weight gain, feelings  of depression Pain or trouble  swallowing Seizures Stroke--sudden numbness or weakness of the face, arm, or leg, trouble speaking, confusion, trouble walking, loss of balance or coordination, dizziness, severe headache, change in vision Sudden eye pain or change in vision such as blurry vision, seeing halos around lights, vision loss Thoughts of suicide or self-harm, worsening mood, feelings of depression Trouble passing urine Uncontrolled and repetitive body movements, muscle stiffness or spasms, tremors or shaking, loss of balance or coordination, restlessness, shuffling walk, which may be signs of extrapyramidal symptoms (EPS) Side effects that usually do not require medical attention (report to your care team if they continue or are bothersome): Constipation Dizziness Drowsiness Dry mouth Weight gain This list may not describe all possible side effects. Call your doctor for medical advice about side effects. You may report side effects to FDA at 1-800-FDA-1088. Where should I keep my medication? Keep out of the reach of children. Store at room temperature between 15 and 30 degrees C (59 and 86 degrees F). Throw away any unused medication after the expiration date. NOTE: This sheet is a summary. It may not cover all possible information. If you have questions about this medicine, talk to your doctor, pharmacist, or health care provider.  2024 Elsevier/Gold Standard (2021-07-29 00:00:00)

## 2023-10-01 ENCOUNTER — Ambulatory Visit
Admission: RE | Admit: 2023-10-01 | Discharge: 2023-10-01 | Disposition: A | Discharge status: Home / Self Care | Source: Ambulatory Visit | Attending: Psychiatry | Admitting: Psychiatry

## 2023-10-01 ENCOUNTER — Other Ambulatory Visit
Admission: RE | Admit: 2023-10-01 | Discharge: 2023-10-01 | Disposition: A | Discharge status: Home / Self Care | Source: Ambulatory Visit | Attending: Psychiatry | Admitting: Psychiatry

## 2023-10-01 ENCOUNTER — Telehealth: Payer: Self-pay | Admitting: Psychiatry

## 2023-10-01 DIAGNOSIS — F429 Obsessive-compulsive disorder, unspecified: Secondary | ICD-10-CM | POA: Insufficient documentation

## 2023-10-01 DIAGNOSIS — Z9189 Other specified personal risk factors, not elsewhere classified: Secondary | ICD-10-CM | POA: Insufficient documentation

## 2023-10-01 DIAGNOSIS — Z79899 Other long term (current) drug therapy: Secondary | ICD-10-CM | POA: Diagnosis not present

## 2023-10-01 NOTE — Telephone Encounter (Signed)
Attempted to contact patient to discuss her concern, had to leave a voicemail. 

## 2023-10-02 ENCOUNTER — Telehealth: Payer: Self-pay | Admitting: Psychiatry

## 2023-10-02 ENCOUNTER — Ambulatory Visit

## 2023-10-02 DIAGNOSIS — F429 Obsessive-compulsive disorder, unspecified: Secondary | ICD-10-CM

## 2023-10-02 DIAGNOSIS — G4700 Insomnia, unspecified: Secondary | ICD-10-CM

## 2023-10-02 DIAGNOSIS — F4310 Post-traumatic stress disorder, unspecified: Secondary | ICD-10-CM

## 2023-10-02 MED ORDER — QUETIAPINE FUMARATE 25 MG PO TABS: 25.0000 mg | ORAL_TABLET | Freq: Every day | ORAL | 1 refills | Status: DC

## 2023-10-02 MED ORDER — TRAZODONE HCL 50 MG PO TABS
50.0000 mg | ORAL_TABLET | Freq: Every evening | ORAL | Status: DC | PRN
Start: 2023-10-02 — End: 2023-11-04

## 2023-10-02 NOTE — Telephone Encounter (Signed)
 Returned call to patient.  Provided medication education about Seroquel .  She is agreeable to trial and monitoring for side effects including weight gain.  Agrees to stop the temazepam and use the trazodone  only as needed.  Will start Seroquel  25 mg at bedtime.  Reviewed and discussed most recent EKG-normal sinus rhythm.  QTc acceptable range.

## 2023-10-06 LAB — URINE DRUGS OF ABUSE SCREEN W ALC, ROUTINE (REF LAB)
Amphetamines, Urine: NEGATIVE ng/mL
Barbiturate, Ur: NEGATIVE ng/mL
Cannabinoid Quant, Ur: NEGATIVE ng/mL
Cocaine (Metab.): NEGATIVE ng/mL
Creatinine, Urine: 168.4 mg/dL (ref 20.0–300.0)
Ethanol U, Quan: NEGATIVE %
Methadone Screen, Urine: NEGATIVE ng/mL
Nitrite Urine, Quantitative: NEGATIVE ug/mL
OPIATE SCREEN URINE: NEGATIVE ng/mL
Phencyclidine, Ur: NEGATIVE ng/mL
Propoxyphene, Urine: NEGATIVE ng/mL
pH, Urine: 5.3 (ref 4.5–8.9)

## 2023-10-06 LAB — DRUG PROFILE 799031
BENZODIAZEPINES: POSITIVE — AB
HYDROXYALPRAZOLAM: NEGATIVE
NORDIAZEPAM: NEGATIVE
Oxazepam Gc/Ms Conf, Ur: 3116 ng/mL
Oxazepam: POSITIVE — AB

## 2023-10-07 ENCOUNTER — Ambulatory Visit: Payer: Self-pay | Admitting: Psychiatry

## 2023-10-21 ENCOUNTER — Ambulatory Visit: Payer: Self-pay | Admitting: Psychiatry

## 2023-10-23 ENCOUNTER — Ambulatory Visit: Admitting: Plastic Surgery

## 2023-10-23 VITALS — BP 124/84 | HR 77 | Ht 62.0 in | Wt 145.6 lb

## 2023-10-23 DIAGNOSIS — R5382 Chronic fatigue, unspecified: Secondary | ICD-10-CM

## 2023-10-23 DIAGNOSIS — M542 Cervicalgia: Secondary | ICD-10-CM | POA: Diagnosis not present

## 2023-10-23 DIAGNOSIS — G8929 Other chronic pain: Secondary | ICD-10-CM

## 2023-10-23 DIAGNOSIS — F331 Major depressive disorder, recurrent, moderate: Secondary | ICD-10-CM

## 2023-10-23 DIAGNOSIS — N62 Hypertrophy of breast: Secondary | ICD-10-CM

## 2023-10-23 DIAGNOSIS — M545 Low back pain, unspecified: Secondary | ICD-10-CM

## 2023-10-23 DIAGNOSIS — Z6826 Body mass index (BMI) 26.0-26.9, adult: Secondary | ICD-10-CM | POA: Diagnosis not present

## 2023-10-23 NOTE — Progress Notes (Signed)
 Patient ID: Gloria Harrison, female    DOB: [birth year], 58 y.o.   MRN: [id number]   Chief Complaint  Patient presents with   Advice Only    Mammary Hyperplasia: The patient is a 58 y.o. female with a history of mammary hyperplasia for several years.  She has extremely large breasts causing symptoms that include the following: Back pain in the upper and lower back, including neck pain. She pulls or pins her bra straps to provide better lift and relief of the pressure and pain. She notices relief by holding her breast up manually.  Her shoulder straps cause grooves and pain and pressure that requires padding for relief. Pain medication is sometimes required with motrin and tylenol.  Activities that are hindered by enlarged breasts include: exercise and running.  She has tried supportive clothing as well as fitted bras without improvement.  Her breasts are extremely large and fairly symmetric.  She has hyperpigmentation of the inframammary area on both sides.  The sternal to nipple distance on the right is 27 cm and the left is 28 cm.  The IMF distance is 10 cm.  She is 5 feet 2 inches tall and weighs 145 pounds.  The BMI = 26.5 kg/m.  Preoperative bra size = 38C cup.  The estimated excess breast tissue to be removed at the time of surgery = 300-350 grams on the left and 300-350 grams on the right.  Mammogram history: due 10/25.  Family history of breast cancer:  no.  Tobacco use:  no.   The patient expresses the desire to pursue surgical intervention.  The latest hemoglobin A1c from March was 5.8.  The patient did have a hysterectomy without any complications.     Review of Systems  Constitutional:  Positive for activity change. Negative for appetite change.  Eyes: Negative.   Respiratory: Negative.    Cardiovascular: Negative.   Gastrointestinal: Negative.   Endocrine: Negative.   Genitourinary: Negative.   Musculoskeletal:  Positive for back pain and neck pain.  Skin:  Positive for rash.     Past Medical History:  Diagnosis Date   Anxiety    Depression    GERD (gastroesophageal reflux disease)    Kidney stones 2015   twice   OCD (obsessive compulsive disorder)     Past Surgical History:  Procedure Laterality Date   CESAREAN SECTION  1996   CESAREAN SECTION  2001   COLONOSCOPY WITH PROPOFOL  N/A 03/24/2018   Procedure: COLONOSCOPY WITH PROPOFOL ;  Surgeon: Janalyn Keene NOVAK, MD;  Location: [facility] ENDOSCOPY;  Service: Endoscopy;  Laterality: N/A;   COSMETIC SURGERY  2002   Tummy tuck   ESOPHAGOGASTRODUODENOSCOPY (EGD) WITH PROPOFOL  N/A 03/24/2018   Procedure: ESOPHAGOGASTRODUODENOSCOPY (EGD) WITH PROPOFOL ;  Surgeon: Janalyn Keene NOVAK, MD;  Location: [facility] ENDOSCOPY;  Service: Endoscopy;  Laterality: N/A;   LITHOTRIPSY  2006 & 2008   PARTIAL HYSTERECTOMY  03/2021   TONSILLECTOMY  1990      Current Outpatient Medications:    Cholecalciferol (VITAMIN D ) 50 MCG (2000 UT) CAPS, Take by mouth., Disp: , Rfl:    DULoxetine (CYMBALTA) 30 MG capsule, Take 30 mg by mouth daily., Disp: , Rfl:    Glycerin (OPTASE COMFORT DRY EYE OP), Apply to eye., Disp: , Rfl:    MIEBO 1.338 GM/ML SOLN, 4 drops daily. Each eye, Disp: , Rfl:    Omega-3 Fatty Acids (FISH OIL) 1000 MG CAPS, Take by mouth., Disp: , Rfl:    pantoprazole  (PROTONIX ) 40  MG tablet, Take 1 tablet (40 mg total) by mouth daily before breakfast., Disp: 90 tablet, Rfl: 3   pravastatin  (PRAVACHOL ) 10 MG tablet, Take 1 tablet (10 mg total) by mouth at bedtime., Disp: 90 tablet, Rfl: 1   progesterone (PROMETRIUM) 100 MG capsule, Take 100 mg by mouth daily., Disp: , Rfl:    QUEtiapine  (SEROQUEL ) 25 MG tablet, Take 1 tablet (25 mg total) by mouth at bedtime., Disp: 30 tablet, Rfl: 1   testosterone (ANDROGEL) 50 MG/5GM (1%) GEL, Place 5 g onto the skin daily., Disp: , Rfl:    traZODone  (DESYREL ) 50 MG tablet, Take 1 tablet (50 mg total) by mouth at bedtime as needed for sleep., Disp: , Rfl:    pantoprazole  (PROTONIX ) 40 MG  tablet, Take 1 tablet by mouth daily before breakfast. (Patient not taking: Reported on 10/23/2023), Disp: , Rfl:    Objective:   Vitals:   10/23/23 0951  BP: 124/84  Pulse: 77  SpO2: 98%    Physical Exam Vitals reviewed.  Constitutional:      Appearance: Normal appearance.  HENT:     Head: Atraumatic.  Cardiovascular:     Rate and Rhythm: Normal rate.     Pulses: Normal pulses.  Pulmonary:     Effort: Pulmonary effort is normal.  Abdominal:     Palpations: Abdomen is soft.  Skin:    General: Skin is warm.     Capillary Refill: Capillary refill takes less than 2 seconds.  Neurological:     Mental Status: She is alert and oriented to person, place, and time.  Psychiatric:        Mood and Affect: Mood normal.        Behavior: Behavior normal.        Thought Content: Thought content normal.        Judgment: Judgment normal.     Assessment & Plan:  Moderate episode of recurrent major depressive disorder (HCC)  Symptomatic mammary hypertrophy  Chronic fatigue  Chronic bilateral low back pain without sciatica  The procedure the patient selected and that was best for the patient was discussed. The risk were discussed and include but not limited to the following:  Breast asymmetry, fluid accumulation, firmness of the breast, inability to breast feed, loss of nipple or areola, skin loss, change in skin and nipple sensation, fat necrosis of the breast tissue, bleeding, infection and healing delay.  There are risks of anesthesia and injury to nerves or blood vessels.  Allergic reaction to tape, suture and skin glue are possible.  There will be swelling.  Any of these can lead to the need for revisional surgery which is not included in this surgery.  A breast reduction has potential to interfere with diagnostic procedures in the future.  This procedure is best done when the breast is fully developed.  Changes in the breast will continue to occur over time: pregnancy, weight gain or  weigh loss. No guarantees are given for a certain bra or breast size.    Total time: 40 minutes. This includes time spent with the patient during the visit as well as time spent before and after the visit reviewing the chart, documenting the encounter, ordering pertinent studies and literature for the patient.   Physical therapy:  not required Mammogram:  due 10/25  The patient is a candidate for bilateral breast reduction with liposuction she understands there is a certain amount of tissue that has to be removed for insurance to cover it.  She would like to see if she will qualify.  Pictures were obtained of the patient and placed in the chart with the patient's or guardian's permission.   [name] Afsheen Antony, DO

## 2023-10-26 ENCOUNTER — Other Ambulatory Visit: Payer: Self-pay | Admitting: Family Medicine

## 2023-10-26 DIAGNOSIS — Z1231 Encounter for screening mammogram for malignant neoplasm of breast: Secondary | ICD-10-CM

## 2023-11-04 ENCOUNTER — Other Ambulatory Visit: Payer: Self-pay

## 2023-11-04 ENCOUNTER — Ambulatory Visit: Admitting: Psychiatry

## 2023-11-04 ENCOUNTER — Telehealth: Payer: Self-pay | Admitting: Plastic Surgery

## 2023-11-04 ENCOUNTER — Encounter: Payer: Self-pay | Admitting: Psychiatry

## 2023-11-04 VITALS — BP 113/74 | HR 76 | Temp 97.3°F | Ht 62.0 in | Wt 146.2 lb

## 2023-11-04 DIAGNOSIS — F4310 Post-traumatic stress disorder, unspecified: Secondary | ICD-10-CM

## 2023-11-04 DIAGNOSIS — F429 Obsessive-compulsive disorder, unspecified: Secondary | ICD-10-CM | POA: Diagnosis not present

## 2023-11-04 DIAGNOSIS — G4701 Insomnia due to medical condition: Secondary | ICD-10-CM | POA: Diagnosis not present

## 2023-11-04 MED ORDER — QUETIAPINE FUMARATE 50 MG PO TABS: 50.0000 mg | ORAL_TABLET | Freq: Every day | ORAL | 1 refills | Status: DC

## 2023-11-04 NOTE — Progress Notes (Unsigned)
 [facility] MD OP Progress Note  11/04/2023 5:22 PM Gloria Harrison  MRN:  [id number]  Chief Complaint:  Chief Complaint  Patient presents with   Follow-up   Anxiety   Depression   Medication Refill   Discussed the use of AI scribe software for clinical note transcription with the patient, who gave verbal consent to proceed.  History of Present Illness Gloria Harrison is a 58 year old Caucasian female, married, lives in [place], has a history of PTSD, OCD, insomnia, hyperlipidemia, history of hysterectomy currently on hormonal replacement was evaluated in office today for a follow-up appointment.  Ongoing symptoms of anxiety and insomnia continue, with persistent difficulty sleeping at night and inability to nap during the day, which she reports relates in part to menopause. She notes some recent improvement in sleep, describing a good night of sleep the night before the visit. Continued anxiety remains a concern, and she currently engages in therapy for post-traumatic stress, meeting with a therapist through the [employer] app about once a week or every other week for the past year. She identifies trauma as a significant factor in her current symptoms and is currently engaged in therapy for post-traumatic stress.  She continues to use Seroquel  for mood and sleep, and she describes mild improvement but feels she is not quite there yet. She experiences occasional jerks at night when lying down, which she believes may have predated Seroquel . She denies weight gain and reports being careful with cravings. She takes Cymbalta (duloxetine) in the morning at a dose prescribed by her gynecologist, which she believes is 30 mg, and she has not changed the dose recently. She remains unsure if Cymbalta has made a difference for her mood or menopausal symptoms. Approximately 2 weeks ago, she stopped taking trazodone  for sleep and has been attempting to sleep without it, experiencing some initial difficulty but gradual  improvement. She denies current use of benzodiazepines and states her gynecologist previously prescribed temazepam and clonazepam for sleep but she is not currently taking them.  She denies any thoughts of hurting herself or others.      Visit Diagnosis:    ICD-10-CM   1. PTSD (post-traumatic stress disorder)  F43.10 QUEtiapine  (SEROQUEL ) 50 MG tablet    2. Obsessive-compulsive disorder, unspecified type  F42.9 QUEtiapine  (SEROQUEL ) 50 MG tablet    3. Insomnia due to medical condition  G47.01    mood symptoms, hot flashes      Past Psychiatric History: I have reviewed past psychiatric history from progress note on 09/29/2023.  Past trials of Celexa , Cymbalta, trazodone , temazepam, clonazepam  Past Medical History:  Past Medical History:  Diagnosis Date   Anxiety    Depression    GERD (gastroesophageal reflux disease)    Kidney stones 2015   twice   OCD (obsessive compulsive disorder)     Past Surgical History:  Procedure Laterality Date   CESAREAN SECTION  1996   CESAREAN SECTION  2001   COLONOSCOPY WITH PROPOFOL  N/A 03/24/2018   Procedure: COLONOSCOPY WITH PROPOFOL ;  Surgeon: Janalyn Keene NOVAK, MD;  Location: [facility] ENDOSCOPY;  Service: Endoscopy;  Laterality: N/A;   COSMETIC SURGERY  2002   Tummy tuck   ESOPHAGOGASTRODUODENOSCOPY (EGD) WITH PROPOFOL  N/A 03/24/2018   Procedure: ESOPHAGOGASTRODUODENOSCOPY (EGD) WITH PROPOFOL ;  Surgeon: Janalyn Keene NOVAK, MD;  Location: [facility] ENDOSCOPY;  Service: Endoscopy;  Laterality: N/A;   LITHOTRIPSY  2006 & 2008   PARTIAL HYSTERECTOMY  03/2021   TONSILLECTOMY  1990    Family Psychiatric History: I have  reviewed family psychiatric history from progress note on 09/29/2023.  Family History:  Family History  Problem Relation Age of Onset   Kidney Stones Mother    Hyperlipidemia Mother    Mental illness Mother    Hyperlipidemia Father    Hypertension Father    Cancer Father        Jaw tumor   Hypothyroidism Sister    Prostate  cancer Maternal Uncle    Diabetes Maternal Uncle    Alcohol abuse Son    Bipolar disorder Son    Asthma Son    Suicidality Son    Colon cancer Neg Hx    Breast cancer Neg Hx     Social History: I have reviewed social history from progress note on 09/29/2023. Social History   Socioeconomic History   Marital status: Married    Spouse name: Not on file   Number of children: 2   Years of education: Not on file   Highest education level: Bachelor's degree (e.g., BA, AB, BS)  Occupational History   Occupation: Civil engineer, contracting   Occupation: Programme researcher, broadcasting/film/video   Tobacco Use   Smoking status: Never   Smokeless tobacco: Never  Vaping Use   Vaping status: Never Used  Substance and Sexual Activity   Alcohol use: No    Alcohol/week: 0.0 standard drinks of alcohol   Drug use: No   Sexual activity: Yes    Partners: Male    Birth control/protection: Other-see comments    Comment: Husband had a Vasectomy.  Other Topics Concern   Not on file  Social History Narrative   Not on file   Social Drivers of Health   Financial Resource Strain: Medium Risk (03/21/2023)   Received from [facility]   Overall Financial Resource Strain (CARDIA)    Difficulty of Paying Living Expenses: Somewhat hard  Food Insecurity: No Food Insecurity (03/21/2023)   Received from [facility]   Hunger Vital Sign    Within the past 12 months, you worried that your food would run out before you got the money to buy more.: Never true    Within the past 12 months, the food you bought just didn't last and you didn't have money to get more.: Never true  Transportation Needs: No Transportation Needs (03/21/2023)   Received from [facility] - Transportation    In the past 12 months, has lack of transportation kept you from medical appointments or from getting medications?: No    Lack of Transportation (Non-Medical): No  Physical Activity: Not on  file  Stress: Not on file  Social Connections: Not on file    Allergies:  Allergies  Allergen Reactions   Ciprofloxacin Hives   Ropinirole  Nausea Only    Metabolic Disorder Labs: Lab Results  Component Value Date   HGBA1C 5.8 (H) 04/17/2023   MPG 120 04/17/2023   MPG 114 11/05/2018   No results found for: PROLACTIN Lab Results  Component Value Date   CHOL 325 (H) 04/17/2023   TRIG 181 (H) 04/17/2023   HDL 52 04/17/2023   CHOLHDL 6.3 (H) 04/17/2023   LDLCALC 238 (H) 04/17/2023   LDLCALC 177 (H) 11/05/2018   Lab Results  Component Value Date   TSH 0.86 04/17/2023   TSH 0.723 12/02/2019    Therapeutic Level Labs: No results found for: LITHIUM No results found for: VALPROATE No results found for: CBMZ  Current Medications: Current Outpatient Medications  Medication  Sig Dispense Refill   QUEtiapine  (SEROQUEL ) 50 MG tablet Take 1 tablet (50 mg total) by mouth at bedtime. 30 tablet 1   Cholecalciferol (VITAMIN D ) 50 MCG (2000 UT) CAPS Take by mouth.     DULoxetine (CYMBALTA) 30 MG capsule Take 30 mg by mouth daily.     Glycerin (OPTASE COMFORT DRY EYE OP) Apply to eye.     MIEBO 1.338 GM/ML SOLN 4 drops daily. Each eye     Omega-3 Fatty Acids (FISH OIL) 1000 MG CAPS Take by mouth.     pantoprazole  (PROTONIX ) 40 MG tablet Take 1 tablet (40 mg total) by mouth daily before breakfast. 90 tablet 3   pravastatin  (PRAVACHOL ) 10 MG tablet Take 1 tablet (10 mg total) by mouth at bedtime. 90 tablet 1   progesterone (PROMETRIUM) 100 MG capsule Take 200 mg by mouth daily.     testosterone (ANDROGEL) 50 MG/5GM (1%) GEL Place 5 g onto the skin daily.     No current facility-administered medications for this visit.     Musculoskeletal: Strength & Muscle Tone: within normal limits Gait & Station: normal Patient leans: N/A  Psychiatric Specialty Exam: Review of Systems  Psychiatric/Behavioral:  Positive for dysphoric mood and sleep disturbance. The patient is  nervous/anxious.     Blood pressure 113/74, pulse 76, temperature (!) 97.3 F (36.3 C), temperature source Temporal, height 5' 2 (1.575 m), weight 146 lb 3.2 oz (66.3 kg), last menstrual period 08/29/2020.Body mass index is 26.74 kg/m.  General Appearance: Casual  Eye Contact:  Fair  Speech:  Clear and Coherent  Volume:  Normal  Mood:  Anxious and Depressed  Affect:  Appropriate  Thought Process:  Goal Directed and Descriptions of Associations: Intact  Orientation:  Full (Time, Place, and Person)  Thought Content: Logical   Suicidal Thoughts:  No  Homicidal Thoughts:  No  Memory:  Immediate;   Fair Recent;   Fair Remote;   Fair  Judgement:  Fair  Insight:  Fair  Psychomotor Activity:  Normal  Concentration:  Concentration: Fair and Attention Span: Fair  Recall:  [employer] of Knowledge: Fair  Language: Fair  Akathisia:  No  Handed:  Right  AIMS (if indicated): done  Assets:  Communication Skills Desire for Improvement Housing Social Support Transportation  ADL's:  Intact  Cognition: WNL  Sleep:  Improving although complicated by hot flashes   Screenings: GAD-7    Flowsheet Row Office Visit from 11/04/2023 in [facility] Office Visit from 09/29/2023 in [facility] Office Visit from 08/07/2023 in [facility] Office Visit from 04/17/2023 in [facility] Office Visit from 11/28/2022 in [facility]  Total GAD-7 Score 15 21 21 14  0   PHQ2-9    Flowsheet Row Office Visit from 11/04/2023 in [facility] Office Visit from 09/29/2023 in [facility] Office Visit from 08/07/2023 in [facility] Office Visit from 04/17/2023 in [facility] Office Visit from 11/28/2022 in [facility]  PHQ-2 Total Score 3 5 4  0 0  PHQ-9 Total Score 10 19 14 5 1    Flowsheet Row Office Visit from 09/29/2023 in [facility]  C-SSRS RISK CATEGORY Moderate Risk     Assessment and Plan: BERLIN VIERECK is a 58 year old Caucasian female  with  history of PTSD, OCD, insomnia was evaluated in office today for a follow-up appointment.  PTSD-improving She does report some improvement on the current medication regimen.  Motivated to stay in psychotherapy. Increase Seroquel  to 50 mg at bedtime Continue Cymbalta 30 mg daily, consider increasing Cymbalta in 2 weeks from now. Discontinue trazodone  for lack of benefit.  Obsessive-compulsive disorder unspecified-unstable Continues to have compulsive cleaning and need for an order. Encouraged to keep with Ms. Berwyn Uchegbu Continue Cymbalta as prescribed.  Insomnia likely secondary to medical condition-improving Some improvement with the Seroquel  although complicated by hot flashes likely menopausal currently on hormonal replacement treatment. Increase Seroquel  to 50 mg at bedtime   Follow-up Follow-up in clinic in 6 weeks or sooner if needed.  Collaboration of Care: Collaboration of Care: Referral or follow-up with counselor/therapist AEB patient to continue psychotherapy sessions.  Patient/Guardian was advised Release of Information must be obtained prior to any record release in order to collaborate their care with an outside provider. Patient/Guardian was advised if they have not already done so to contact the registration department to sign all necessary forms in order for us  to release information regarding their care.   Consent: Patient/Guardian gives verbal consent for treatment and assignment of benefits for services provided during this visit. Patient/Guardian expressed understanding and agreed to proceed.   This note was generated in part or whole with voice recognition software.  Voice recognition is usually quite accurate but there are transcription errors that can and very often do occur. I apologize for any typographical errors that were not detected and corrected.    Pristine Gladhill, MD 11/04/2023, 5:22 PM

## 2023-11-04 NOTE — Telephone Encounter (Signed)
Called to schedule surgery  °

## 2023-11-07 ENCOUNTER — Other Ambulatory Visit: Payer: Self-pay | Admitting: Family Medicine

## 2023-11-07 DIAGNOSIS — E78019 Familial hypercholesterolemia, unspecified: Secondary | ICD-10-CM

## 2023-11-09 ENCOUNTER — Telehealth: Payer: Self-pay

## 2023-11-09 DIAGNOSIS — F429 Obsessive-compulsive disorder, unspecified: Secondary | ICD-10-CM

## 2023-11-09 DIAGNOSIS — F4310 Post-traumatic stress disorder, unspecified: Secondary | ICD-10-CM

## 2023-11-09 MED ORDER — LAMOTRIGINE 25 MG PO TABS: 25.0000 mg | ORAL_TABLET | Freq: Every day | ORAL | 0 refills | Status: DC

## 2023-11-09 MED ORDER — QUETIAPINE FUMARATE 50 MG PO TABS
25.0000 mg | ORAL_TABLET | Freq: Every day | ORAL | Status: AC
Start: 2023-11-09 — End: ?

## 2023-11-09 NOTE — Telephone Encounter (Signed)
 Contacted patient by phone.  Discussed adding Lamictal which she was interested in.  Provided medication education including risk of Stevens-Johnson syndrome, cardiac side effects.  Patient to get immediate help if she has any side effects.  Patient to reduce Seroquel  to 25 mg for the next couple of nights and see if that helps with the restless leg symptoms.  If she still has side effects she could stop the Seroquel .  Patient to schedule a sooner appointment for medication management.

## 2023-11-09 NOTE — Telephone Encounter (Signed)
 Medication problem - Message from patient stating she had tried going up on her Quetiapine  to 50 mg at bedtime but that this caused significant restless leg movement so she went back down to 25 mg but was still occuring. Patient would like to discuss trying something different than Quetiapine  as is concerned no matter how low the dosages the restless leg issues will continue.

## 2023-11-09 NOTE — Telephone Encounter (Signed)
 Yes I will

## 2023-11-09 NOTE — Telephone Encounter (Signed)
 I have sent patient MyChart communication regarding this.

## 2023-11-10 NOTE — Telephone Encounter (Signed)
 Requested Prescriptions  Pending Prescriptions Disp Refills   pravastatin  (PRAVACHOL ) 10 MG tablet [Pharmacy Med Name: Pravastatin  Sodium 10 MG Oral Tablet] 90 tablet 0    Sig: TAKE 1 TABLET BY MOUTH AT BEDTIME     Cardiovascular:  Antilipid - Statins Failed - 11/10/2023 11:02 AM      Failed - Lipid Panel in normal range within the last 12 months    Cholesterol  Date Value Ref Range Status  04/17/2023 325 (H) <200 mg/dL Final   LDL Cholesterol (Calc)  Date Value Ref Range Status  04/17/2023 238 (H) mg/dL (calc) Final    Comment:    LDL-C levels > or = 190 mg/dL may indicate familial  hypercholesterolemia (FH). Clinical assessment and  measurement of blood lipid levels should be  considered for all first degree relatives of patients with an FH diagnosis. LDL Cholesterol (LDL-C) levels > or = 300 mg/dL may indicate homozygous familial hypercholesterolemia (HoFH). Untreated,  these extremely high LDL-C levels can result in premature CV events and mortality. Patients should be identified early and provided appropriate interventions to reduce the cumulative LDL-C burden from birth. . For questions about testing for familial hypercholesterolemia, please call Engineer, materials at 1.866.GENE.INFO. SABRA Veatrice DASEN, et al. J National Lipid Association  Recommendations for Patient-Centered Management of Dyslipidemia: Part 1 Journal of  Clinical Lipidology 2015;9(2), 129-169. Cuchel, M. et al. (2014). Homozygous familial hypercholesterolaemia: new insights and guidance for clinicians to improve detection and clinical management. European Heart Journal, 35(32), [phone]. Reference range: <100 . Desirable range <100 mg/dL for primary prevention;   <70 mg/dL for patients with CHD or diabetic patients  with > or = 2 CHD risk factors. SABRA LDL-C is now calculated using the Martin-Hopkins  calculation, which is a validated novel method providing  better accuracy than the  Friedewald equation in the  estimation of LDL-C.  Gladis APPLETHWAITE et al. SANDREA. 7986;689(80): 2061-2068  (http://education.Quest[employer].com/faq/FAQ164)    HDL  Date Value Ref Range Status  04/17/2023 52 > OR = 50 mg/dL Final   Triglycerides  Date Value Ref Range Status  04/17/2023 181 (H) <150 mg/dL Final         Passed - Patient is not pregnant      Passed - Valid encounter within last 12 months    Recent Outpatient Visits           3 months ago Other obsessive-compulsive disorders   [facility] [place] Marsa PARAS, DO   6 months ago Peripheral polyneuropathy   [facility] [place], Marsa PARAS, [place]

## 2023-11-13 ENCOUNTER — Encounter: Payer: Self-pay | Admitting: Psychiatry

## 2023-11-13 ENCOUNTER — Telehealth: Admitting: Psychiatry

## 2023-11-13 DIAGNOSIS — G4701 Insomnia due to medical condition: Secondary | ICD-10-CM

## 2023-11-13 DIAGNOSIS — F4310 Post-traumatic stress disorder, unspecified: Secondary | ICD-10-CM | POA: Diagnosis not present

## 2023-11-13 DIAGNOSIS — F429 Obsessive-compulsive disorder, unspecified: Secondary | ICD-10-CM

## 2023-11-13 MED ORDER — ESZOPICLONE 1 MG PO TABS: 1.0000 mg | ORAL_TABLET | Freq: Every evening | ORAL | 0 refills | Status: DC | PRN

## 2023-11-13 NOTE — Progress Notes (Signed)
 Virtual Visit via Video Note  I connected with Gloria Harrison on 11/13/23 at  9:00 AM EDT by a video enabled telemedicine application and verified that I am speaking with the correct person using two identifiers.  Location Provider Location : [facility] Patient Location : Home  Participants: Patient , Provider   I discussed the limitations of evaluation and management by telemedicine and the availability of in person appointments. The patient expressed understanding and agreed to proceed.   I discussed the assessment and treatment plan with the patient. The patient was provided an opportunity to ask questions and all were answered. The patient agreed with the plan and demonstrated an understanding of the instructions.   The patient was advised to call back or seek an in-person evaluation if the symptoms worsen or if the condition fails to improve as anticipated.   BH MD OP Progress Note  11/13/2023 9:29 AM Gloria Harrison  MRN:  [id number]  Chief Complaint:  Chief Complaint  Patient presents with   Follow-up   Anxiety   Obsessions and compuslive symptoms   Medication Refill   Discussed the use of AI scribe software for clinical note transcription with the patient, who gave verbal consent to proceed.  History of Present Illness Gloria Harrison is a 58 year old Caucasian female, married, lives in [place], has a history of PTSD, OCD, insomnia, hyperlipidemia, history of hysterectomy currently on hormonal replacement was evaluated by telemedicine today.  Significant difficulty with sleep continues, as she describes severe insomnia that recent medication changes have exacerbated. She notes that quetiapine  (Seroquel ), prescribed as a mood stabilizer and sleep aid, triggered severe restless leg symptoms on both the initial and increased doses. She became fearful of continuing quetiapine  and stopped it. Over the past 2 days, she has relied on temazepam from a previous prescription at night for sleep  to ensure she is rested enough for work. She also reports that lamotrigine which was recently added for mood stabilization,did not help her sleep and resulted in only 2 hours of rest, leading her to discontinue it. She currently takes duloxetine 30 mg every morning for mood and continues to use temazepam at night for sleep.  She continues to have a longstanding sensitivity to medications that can trigger restless leg symptoms, including Benadryl, melatonin, and NyQuil, and she notes that even small doses of melatonin or NyQuil can provoke these symptoms. She expresses frustration and concern about the limited options for managing both her sleep and mood symptoms without exacerbating restless leg syndrome.  She has engaged in therapy for over a year, meeting with her therapist weekly or every 2 weeks, and she describes therapy as helpful, particularly for her post-traumatic stress symptoms. She notes that her therapist does not provide EMDR but she consistently experiences progress after sessions.  She denies any current thoughts of hurting herself or others.  She previously trialed Lunesta for sleep, which she found effective but associated with occasional vivid dreams.  She is interested in retrial.     Visit Diagnosis:    ICD-10-CM   1. PTSD (post-traumatic stress disorder)  F43.10 eszopiclone (LUNESTA) 1 MG TABS tablet    2. Obsessive-compulsive disorder, unspecified type  F42.9     3. Insomnia due to medical condition  G47.01 eszopiclone (LUNESTA) 1 MG TABS tablet   Mood symptoms, hot flashes      Past Psychiatric History: I have reviewed past psychiatric history from progress note on 09/29/2023.  Past trials of Celexa , Cymbalta, trazodone , temazepam, clonazepam.  Past Medical History:  Past Medical History:  Diagnosis Date   Anxiety    Depression    GERD (gastroesophageal reflux disease)    Kidney stones 2015   twice   OCD (obsessive compulsive disorder)     Past Surgical  History:  Procedure Laterality Date   CESAREAN SECTION  1996   CESAREAN SECTION  2001   COLONOSCOPY WITH PROPOFOL  N/A 03/24/2018   Procedure: COLONOSCOPY WITH PROPOFOL ;  Surgeon: Janalyn Keene NOVAK, MD;  Location: [facility] ENDOSCOPY;  Service: Endoscopy;  Laterality: N/A;   COSMETIC SURGERY  2002   Tummy tuck   ESOPHAGOGASTRODUODENOSCOPY (EGD) WITH PROPOFOL  N/A 03/24/2018   Procedure: ESOPHAGOGASTRODUODENOSCOPY (EGD) WITH PROPOFOL ;  Surgeon: Janalyn Keene NOVAK, MD;  Location: [facility] ENDOSCOPY;  Service: Endoscopy;  Laterality: N/A;   LITHOTRIPSY  2006 & 2008   PARTIAL HYSTERECTOMY  03/2021   TONSILLECTOMY  1990    Family Psychiatric History: I have reviewed family psychiatric history from progress note on 09/29/2023.  Family History:  Family History  Problem Relation Age of Onset   Kidney Stones Mother    Hyperlipidemia Mother    Mental illness Mother    Hyperlipidemia Father    Hypertension Father    Cancer Father        Jaw tumor   Hypothyroidism Sister    Prostate cancer Maternal Uncle    Diabetes Maternal Uncle    Alcohol abuse Son    Bipolar disorder Son    Asthma Son    Suicidality Son    Colon cancer Neg Hx    Breast cancer Neg Hx     Social History: I have reviewed social history from progress note on 09/29/2023. Social History   Socioeconomic History   Marital status: Married    Spouse name: Not on file   Number of children: 2   Years of education: Not on file   Highest education level: Bachelor's degree (e.g., BA, AB, BS)  Occupational History   Occupation: Civil engineer, contracting   Occupation: Programme researcher, broadcasting/film/video   Tobacco Use   Smoking status: Never   Smokeless tobacco: Never  Vaping Use   Vaping status: Never Used  Substance and Sexual Activity   Alcohol use: No    Alcohol/week: 0.0 standard drinks of alcohol   Drug use: No   Sexual activity: Yes    Partners: Male    Birth control/protection: Other-see comments    Comment: Husband had a  Vasectomy.  Other Topics Concern   Not on file  Social History Narrative   Not on file   Social Drivers of Health   Financial Resource Strain: Medium Risk (03/21/2023)   Received from [facility]   Overall Financial Resource Strain (CARDIA)    Difficulty of Paying Living Expenses: Somewhat hard  Food Insecurity: No Food Insecurity (03/21/2023)   Received from [facility]   Hunger Vital Sign    Within the past 12 months, you worried that your food would run out before you got the money to buy more.: Never true    Within the past 12 months, the food you bought just didn't last and you didn't have money to get more.: Never true  Transportation Needs: No Transportation Needs (03/21/2023)   Received from [facility] - Transportation    In the past 12 months, has lack of transportation kept you from medical appointments or from getting medications?: No    Lack of Transportation (Non-Medical): No  Physical Activity: Not on file  Stress: Not on file  Social Connections: Not on file    Allergies:  Allergies  Allergen Reactions   Ciprofloxacin Hives   Ropinirole  Nausea Only    Metabolic Disorder Labs: Lab Results  Component Value Date   HGBA1C 5.8 (H) 04/17/2023   MPG 120 04/17/2023   MPG 114 11/05/2018   No results found for: PROLACTIN Lab Results  Component Value Date   CHOL 325 (H) 04/17/2023   TRIG 181 (H) 04/17/2023   HDL 52 04/17/2023   CHOLHDL 6.3 (H) 04/17/2023   LDLCALC 238 (H) 04/17/2023   LDLCALC 177 (H) 11/05/2018   Lab Results  Component Value Date   TSH 0.86 04/17/2023   TSH 0.723 12/02/2019    Therapeutic Level Labs: No results found for: LITHIUM No results found for: VALPROATE No results found for: CBMZ  Current Medications: Current Outpatient Medications  Medication Sig Dispense Refill   eszopiclone (LUNESTA) 1 MG TABS tablet Take 1 tablet (1 mg total) by mouth at bedtime as  needed for sleep. Take immediately before bedtime 30 tablet 0   Cholecalciferol (VITAMIN D ) 50 MCG (2000 UT) CAPS Take by mouth.     DULoxetine (CYMBALTA) 30 MG capsule Take 30 mg by mouth daily.     Glycerin (OPTASE COMFORT DRY EYE OP) Apply to eye.     MIEBO 1.338 GM/ML SOLN 4 drops daily. Each eye     Omega-3 Fatty Acids (FISH OIL) 1000 MG CAPS Take by mouth.     pantoprazole  (PROTONIX ) 40 MG tablet Take 1 tablet (40 mg total) by mouth daily before breakfast. 90 tablet 3   pravastatin  (PRAVACHOL ) 10 MG tablet TAKE 1 TABLET BY MOUTH AT BEDTIME 90 tablet 0   progesterone (PROMETRIUM) 100 MG capsule Take 200 mg by mouth daily.     testosterone (ANDROGEL) 50 MG/5GM (1%) GEL Place 5 g onto the skin daily.     No current facility-administered medications for this visit.     Musculoskeletal: Strength & Muscle Tone: UTA Gait & Station: Seated Patient leans: N/A  Psychiatric Specialty Exam: Review of Systems  Psychiatric/Behavioral:  Positive for sleep disturbance. The patient is nervous/anxious.        Mood swings    Last menstrual period 08/29/2020.There is no height or weight on file to calculate BMI.  General Appearance: Casual  Eye Contact:  Fair  Speech:  Clear and Coherent  Volume:  Normal  Mood:  Anxious mood swings   Affect:  Congruent  Thought Process:  Goal Directed and Descriptions of Associations: Intact  Orientation:  Full (Time, Place, and Person)  Thought Content: Logical   Suicidal Thoughts:  No  Homicidal Thoughts:  No  Memory:  Immediate;   Fair Recent;   Fair Remote;   Fair  Judgement:  Fair  Insight:  Fair  Psychomotor Activity:  Normal  Concentration:  Concentration: Fair and Attention Span: Fair  Recall:  [employer] of Knowledge: Fair  Language: Fair  Akathisia:  No  Handed:  Right  AIMS (if indicated): not done  Assets:  Communication Skills Desire for Improvement Housing Social Support  ADL's:  Intact  Cognition: WNL  Sleep:  Poor    Screenings: GAD-7    Loss adjuster, chartered Office Visit from 11/04/2023 in [facility] Office Visit from 09/29/2023 in [facility] Office Visit from 08/07/2023 in [facility] Visit from 04/17/2023 in [facility] Visit from 11/28/2022 in [facility]  Total GAD-7 Score 15 21 21 14  0   PHQ2-9    Flowsheet Row Office Visit from 11/04/2023 in [facility] Office Visit from 09/29/2023 in [facility] Office Visit from 08/07/2023 in [facility] Office Visit from 04/17/2023 in [facility] Office Visit from 11/28/2022 in [facility]  PHQ-2 Total Score 3 5 4  0 0  PHQ-9 Total Score 10 19 14 5 1    Flowsheet Row Video Visit from 11/13/2023 in [facility] Office Visit from 11/04/2023 in [facility] Office Visit from 09/29/2023 in [facility]  C-SSRS RISK CATEGORY Moderate Risk Moderate Risk Moderate Risk     Assessment and Plan: Gloria Harrison is a 58 year old Caucasian female with history of PTSD, OCD, insomnia was evaluated by telemedicine today.  Discussed assessment and plan as noted below.  1. PTSD (post-traumatic stress disorder)-unstable Currently struggling with sleep as well as mood symptoms. Continue psychotherapy sessions with Gloria Harrison Continue Cymbalta 30 mg daily.  Will consider increasing the dosage in the future. Start Lunesta 1 mg at bedtime for sleep. Reviewed [place] PMP AWARxE   2. Obsessive-compulsive disorder, unspecified type-unstable Continues to have compulsive cleaning and need for order. Continue psychotherapy sessions Continue  Cymbalta as prescribed  3. Insomnia due to medical condition-unstable Seroquel  was recently discontinued due to worsening restless leg symptoms. Start Lunesta 1 mg at bedtime Continue sleep hygiene techniques  Follow-up Follow-up in clinic in 2 weeks or sooner if needed.     Collaboration of Care: Collaboration of Care: Referral or follow-up with counselor/therapist AEB encouraged to continue psychotherapy sessions.  Patient/Guardian was advised Release of Information must be obtained prior to any record release in order to collaborate their care with an outside provider. Patient/Guardian was advised if they have not already done so to contact the registration department to sign all necessary forms in order for us  to release information regarding their care.   Consent: Patient/Guardian gives verbal consent for treatment and assignment of benefits for services provided during this visit. Patient/Guardian expressed understanding and agreed to proceed.  This note was generated in part or whole with voice recognition software. Voice recognition is usually quite accurate but there are transcription errors that can and very often do occur. I apologize for any typographical errors that were not detected and corrected.     Gloria Prindle, MD 11/13/2023, 9:29 AM

## 2023-11-13 NOTE — Patient Instructions (Signed)
 Eszopiclone Tablets What is this medication? ESZOPICLONE (es ZOE pi clone) treats insomnia. It helps you go to sleep faster and stay asleep through the night. It is often used for a short period of time. This medicine may be used for other purposes; ask your health care provider or pharmacist if you have questions. COMMON BRAND NAME(S): Lunesta What should I tell my care team before I take this medication? They need to know if you have any of these conditions: Depression Liver disease Lung or breathing disease, such as asthma or COPD Substance use disorder Sleep-walking, driving, eating, or other activity while not fully awake after taking a sleep medication Suicidal thoughts, plans, or attempt by you or a family member An unusual or allergic reaction to eszopiclone, other medications, foods, dyes, or preservatives Pregnant or trying to get pregnant Breastfeeding How should I use this medication? Take this medication by mouth with a glass of water. Follow the directions on the prescription label. It is better to take this medication on an empty stomach and only when you are ready for bed. Do not take your medication more often than directed. If you have been taking this medication for several weeks and suddenly stop taking it, you may get unpleasant withdrawal symptoms. Your care team may want to gradually reduce the dose. Do not stop taking this medication on your own. Always follow your care team's advice. A special MedGuide will be given to you by the pharmacist with each prescription and refill. Be sure to read this information carefully each time. Talk to your care team about the use of this medication in children. Special care may be needed. Overdosage: If you think you have taken too much of this medicine contact a poison control center or emergency room at once. NOTE: This medicine is only for you. Do not share this medicine with others. What if I miss a dose? This does not apply. This  medication should only be taken immediately before going to sleep. Do not take double or extra doses. What may interact with this medication? Lorazepam Medications for fungal infections, such as ketoconazole, fluconazole, itraconazole Olanzapine Supplements, such as melatonin, St. John's wort, valerian This list may not describe all possible interactions. Give your health care provider a list of all the medicines, herbs, non-prescription drugs, or dietary supplements you use. Also tell them if you smoke, drink alcohol, or use illegal drugs. Some items may interact with your medicine. What should I watch for while using this medication? Visit your care team for regular checks on your progress. Keep a regular sleep schedule by going to bed at about the same time nightly. Avoid caffeine-containing drinks in the evening hours, as caffeine can cause trouble with falling asleep. Talk to your care team if you still have trouble sleeping. You may do unusual sleep behaviors or activities you do not remember the day after taking this medication. Activities include driving, making or eating food, talking on the phone, sexual activity, or sleep walking. Stop taking this medication and call your care team right away if you find out you have done activities like this. Plan to go to bed and stay in bed for a full night (7 to 8 hours) after you take this medication. You may still be drowsy the morning after taking this medication. This medication may affect your coordination, reaction time, or judgment. Do not drive or operate machinery until you know how this medication affects you. Sit up or stand slowly to reduce the risk  of dizzy or fainting spells. If you or your family notice any changes in your behavior, such as new or worsening depression, thoughts of harming yourself, anxiety, other unusual or disturbing thoughts, or memory loss, call your care team right away. After you stop taking this medication, you may  have trouble falling asleep. This is called rebound insomnia. This problem usually goes away on its own after 1 or 2 nights. What side effects may I notice from receiving this medication? Side effects that you should report to your care team as soon as possible: Allergic reactions or angioedema--skin rash, itching, hives, swelling of the face, eyes, lips, tongue, arms, or legs, trouble swallowing or breathing CNS depression--slow or shallow breathing, shortness of breath, feeling faint, dizziness, confusion, trouble staying awake Mood and behavior changes--anxiety, nervousness, confusion, hallucinations, irritability, hostility, thoughts of suicide or self-harm, worsening mood, feelings of depression Unusual sleep behaviors or activities you do not remember such as driving, eating, or sexual activity Side effects that usually do not require medical attention (report to your care team if they continue or are bothersome): Dizziness Drowsiness the day after use Dry mouth Headache Metallic taste in mouth This list may not describe all possible side effects. Call your doctor for medical advice about side effects. You may report side effects to FDA at 1-800-FDA-1088. Where should I keep my medication? Keep out of the reach of children and pets. This medication can be abused. Keep it in a safe place to protect it from theft. Do not share it with anyone. It is only for you. Selling or giving away this medication is dangerous and against the law. Store at room temperature between 20 and 25 degrees C (68 and 77 degrees F). Get rid of any unused medication after the expiration date. This medication may cause harm and death if taken by other adults, children, or pets. It is important to get rid of this medication as soon as you no longer need it or it has expired. You can do this in two ways: Take the medication to a medication take-back program. Check with your pharmacy or law enforcement to find a  location. If you cannot return the medication, check the label or package insert to see if the medication should be thrown out in the garbage or flushed down the toilet. If you are not sure, ask your care team. If it is safe to put it in the trash, take the medication out of the container. Mix the medication with cat litter, dirt, coffee grounds, or other unwanted substance. Seal the mixture in a bag or container. Put it in the trash. NOTE: This sheet is a summary. It may not cover all possible information. If you have questions about this medicine, talk to your doctor, pharmacist, or health care provider.  2024 Elsevier/Gold Standard (2022-07-17 00:00:00)

## 2023-11-19 NOTE — H&P (View-Only) (Signed)
 Patient ID: Gloria Harrison, female    DOB: 01-27-1966, 58 y.o.   MRN: [id number]  Preoperative Appointment     ICD-10-CM   1. Symptomatic mammary hypertrophy  N62        History of Present Illness: Gloria Harrison is a 58 y.o.  female  with a history of macromastia.  She presents for preoperative evaluation for upcoming procedure, Bilateral Breast Reduction with liposuction, scheduled for 12/10/2023 with Dr.  Lowery  The patient has not had problems with anesthesia.  Patient reports that she has a mammogram scheduled for 10/31.  Per chart review, patient had mammogram done last year which was BI-RADS Category 2.  Patient denies any personal or family history of breast cancer.  She denies any history of cardiac disease.  Per chart review, patient does have on her problem list a risk of QT prolongation.  She states that she has never had anyone tell her this.  She states that she does not see a cardiologist.  Patient reports she does not take any blood thinners.  Patient reports she is not a smoker.  Patient denies using any oral hormone replacement therapies or birth control.  She does state that she uses hormone creams.  She denies any history of miscarriages.  She denies any personal history of blood clots.  She does report that her aunt had a blood clot, she was unsure of what kind of blood clot she had.  Patient denies any personal or family history of clotting diseases.  She denies any recent surgeries, traumas or infections.  She denies any history of stroke or heart attack. She denies any history of Crohn's disease or ulcerative colitis, COPD or asthma.  She denies any history of cancer.  She denies any varicosities to her lower extremities.  She denies any recent fevers, chills or changes in her health.    Patient reports she is currently a 38C cup, and would like to be a B cup.  Discussed with the patient that cup size cannot be guaranteed.  Patient expressed understanding.  Summary  of Previous Visit: Patient was seen for initial consult by Dr. Lowery on 10/23/2023.  At this visit, patient reported upper back and neck pain due to her enlarged breasts.  On exam, her STN on the right was 27 cm and her STN on the left is 28 cm.  Her BMI was 26.5 kg/m.  Her preoperative bra size was a C cup.  The estimated amount of excess breast tissue to be removed at the time of surgery was 300-350 g each breast.  Patient was found to be a candidate for bilateral breast reduction with liposuction.  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, planning to take 1 week off  PMH Significant for: GERD, OCD, GAD, symptomatic mammary hypertrophy   Past Medical History: Allergies: Allergies  Allergen Reactions   Ciprofloxacin Hives   Ropinirole  Nausea Only    Current Medications:  Current Outpatient Medications:    cephALEXin (KEFLEX) 500 MG capsule, Take 1 capsule (500 mg total) by mouth 4 (four) times daily for 3 days., Disp: 12 capsule, Rfl: 0   Cholecalciferol (VITAMIN D ) 50 MCG (2000 UT) CAPS, Take by mouth., Disp: , Rfl:    DULoxetine (CYMBALTA) 30 MG capsule, Take 30 mg by mouth daily., Disp: , Rfl:    eszopiclone (LUNESTA) 1 MG TABS tablet, Take  1 tablet (1 mg total) by mouth at bedtime as needed for sleep. Take immediately before bedtime, Disp: 30 tablet, Rfl: 0   Glycerin (OPTASE COMFORT DRY EYE OP), Apply to eye., Disp: , Rfl:    MIEBO 1.338 GM/ML SOLN, 4 drops daily. Each eye, Disp: , Rfl:    Omega-3 Fatty Acids (FISH OIL) 1000 MG CAPS, Take by mouth., Disp: , Rfl:    oxyCODONE (ROXICODONE) 5 MG immediate release tablet, Take 1 tablet (5 mg total) by mouth every 6 (six) hours as needed for up to 15 doses for severe pain (pain score 7-10)., Disp: 15 tablet, Rfl: 0   pantoprazole  (PROTONIX ) 40 MG tablet, Take 1 tablet (40 mg total) by mouth daily before breakfast., Disp: 90 tablet, Rfl: 3    pravastatin  (PRAVACHOL ) 10 MG tablet, TAKE 1 TABLET BY MOUTH AT BEDTIME, Disp: 90 tablet, Rfl: 0   progesterone (PROMETRIUM) 100 MG capsule, Take 200 mg by mouth daily., Disp: , Rfl:    testosterone (ANDROGEL) 50 MG/5GM (1%) GEL, Place 5 g onto the skin daily., Disp: , Rfl:   Past Medical Problems: Past Medical History:  Diagnosis Date   Anxiety    Depression    GERD (gastroesophageal reflux disease)    Kidney stones 2015   twice   OCD (obsessive compulsive disorder)     Past Surgical History: Past Surgical History:  Procedure Laterality Date   CESAREAN SECTION  1996   CESAREAN SECTION  2001   COLONOSCOPY WITH PROPOFOL  N/A 03/24/2018   Procedure: COLONOSCOPY WITH PROPOFOL ;  Surgeon: Janalyn Keene NOVAK, MD;  Location: [facility];  Service: Endoscopy;  Laterality: N/A;   COSMETIC SURGERY  2002   Tummy tuck   ESOPHAGOGASTRODUODENOSCOPY (EGD) WITH PROPOFOL  N/A 03/24/2018   Procedure: ESOPHAGOGASTRODUODENOSCOPY (EGD) WITH PROPOFOL ;  Surgeon: Janalyn Keene NOVAK, MD;  Location: [facility] END[facility]COPY;  Service: Endoscopy;  Laterality: N/A;   LITHOTRIPSY  2006 & 2008   PARTIAL HYSTERECTOMY  03/2021   TONSILLECTOMY  1990    Social History: Social History   Socioeconomic History   Marital status: Married    Spouse name: Not on file   Number of children: 2   Years of education: Not on file   Highest education level: Bachelor's degree (e.g., BA, AB, BS)  Occupational History   Occupation: Civil engineer, contracting   Occupation: Programme researcher, broadcasting/film/video   Tobacco Use   Smoking status: Never   Smokeless tobacco: Never  Vaping Use   Vaping status: Never Used  Substance and Sexual Activity   Alcohol use: No    Alcohol/week: 0.0 standard drinks of alcohol   Drug use: No   Sexual activity: Yes    Partners: Male    Birth control/protection: Other-see comments    Comment: Husband had a Vasectomy.  Other Topics Concern   Not on file  Social History Narrative   Not on file   Social  Drivers of Health   Financial Resource Strain: Medium Risk (03/21/2023)   Received from [facility]   Overall Financial Resource Strain (CARDIA)    Difficulty of Paying Living Expenses: Somewhat hard  Food Insecurity: No Food Insecurity (03/21/2023)   Received from [facility]   Hunger Vital Sign    Within the past 12 months, you worried that your food would run out before you got the money to buy more.: Never true    Within the past 12 months, the food you bought just didn't last and you didn't have  money to get more.: Never true  Transportation Needs: No Transportation Needs (03/21/2023)   Received from [facility] - Transportation    In the past 12 months, has lack of transportation kept you from medical appointments or from getting medications?: No    Lack of Transportation (Non-Medical): No  Physical Activity: Not on file  Stress: Not on file  Social Connections: Not on file  Intimate Partner Violence: Not on file    Family History: Family History  Problem Relation Age of Onset   Kidney Stones Mother    Hyperlipidemia Mother    Mental illness Mother    Hyperlipidemia Father    Hypertension Father    Cancer Father        Jaw tumor   Hypothyroidism Sister    Prostate cancer Maternal Uncle    Diabetes Maternal Uncle    Alcohol abuse Son    Bipolar disorder Son    Asthma Son    Suicidality Son    Colon cancer Neg Hx    Breast cancer Neg Hx     Review of Systems: Denies any recent fevers, chills or changes in her health  Physical Exam: Vital Signs BP 132/77   Pulse 87   LMP 08/29/2020 (Approximate)   SpO2 100%   Physical Exam  Constitutional:      General: Not in acute distress.    Appearance: Normal appearance. Not ill-appearing.  HENT:     Head: Normocephalic and atraumatic.  Eyes:     Pupils: Pupils are equal, round Neck:     Musculoskeletal: Normal range of motion.  Cardiovascular:      Rate and Rhythm: Normal rate Pulmonary:     Effort: Pulmonary effort is normal. No respiratory distress.  Musculoskeletal: Normal range of motion.  Skin:    General: Skin is warm and dry.     Findings: No erythema or rash.  Neurological:     Mental Status: Alert and oriented to person, place, and time. Mental status is at baseline.  Psychiatric:        Mood and Affect: Mood normal.        Behavior: Behavior normal.    Assessment/Plan: The patient is scheduled for bilateral breast reduction with Dr. Lowery.  Risks, benefits, and alternatives of procedure discussed, questions answered and consent obtained.    Will send clearance to patient's PCP and to behavioral health.  Smoking Status: Non-smoker; Counseling Given?  N/A Last Mammogram: 11/21/2022 results: BI-RADS Category 2: Benign -it appears patient has mammogram scheduled for 11/27/2023. -Discussed with the patient the importance of obtaining her mammogram prior to surgery.  Discussed with her that she will need to have this before surgery if we are to proceed.  She expressed understanding to this.  Caprini Score: 7; Risk Factors include: Age, BMI greater than 25, family history of thrombosis and length of planned surgery. Recommendation for mechanical and possible pharmacological prophylaxis. Encourage early ambulation.  Will discuss possibility of postoperative Lovenox with Dr. Lowery.  Pictures obtained: @consult   Post-op Rx sent to pharmacy: Keflex, oxycodone -given history of risk of QT prolongation, we will hold off on sending in Zofran for now.  Patient expressed understanding to this.  Instructed patient to hold any multivitamins or supplements at least 1 week prior to surgery.  Discussed with the patient that she should not take her Cymbalta on Lunesta at the same time as pain pills as these can be sedating.  Patient expressed understanding.  Patient  was provided with the breast reduction and General Surgical Risk  consent document and Pain Medication Agreement prior to their appointment.  They had adequate time to read through the risk consent documents and Pain Medication Agreement. We also discussed them in person together during this preop appointment. All of their questions were answered to their satisfaction.  Recommended calling if they have any further questions.  Risk consent form and Pain Medication Agreement to be scanned into patient's chart.  The risk that can be encountered with breast reduction were discussed and include the following but not limited to these:  Breast asymmetry, fluid accumulation, firmness of the breast, inability to breast feed, loss of nipple or areola, skin loss, decrease or no nipple sensation, fat necrosis of the breast tissue, bleeding, infection, healing delay.  There are risks of anesthesia, changes to skin sensation and injury to nerves or blood vessels.  The muscle can be temporarily or permanently injured.  You may have an allergic reaction to tape, suture, glue, blood products which can result in skin discoloration, swelling, pain, skin lesions, poor healing.  Any of these can lead to the need for revisonal surgery or stage procedures.  A reduction has potential to interfere with diagnostic procedures.  Nipple or breast piercing can increase risks of infection.  This procedure is best done when the breast is fully developed.  Changes in the breast will continue to occur over time.  Pregnancy can alter the outcomes of previous breast reduction surgery, weight gain and weigh loss can also effect the long term appearance.     Electronically signed by: Estefana FORBES Peck, PA-C 11/20/2023 4:08 PM

## 2023-11-19 NOTE — Progress Notes (Signed)
 Patient ID: Gloria Harrison, female    DOB: 01-27-1966, 58 y.o.   MRN: [id number]  Preoperative Appointment     ICD-10-CM   1. Symptomatic mammary hypertrophy  N62        History of Present Illness: Gloria Harrison is a 58 y.o.  female  with a history of macromastia.  She presents for preoperative evaluation for upcoming procedure, Bilateral Breast Reduction with liposuction, scheduled for 12/10/2023 with Dr.  Lowery  The patient has not had problems with anesthesia.  Patient reports that she has a mammogram scheduled for 10/31.  Per chart review, patient had mammogram done last year which was BI-RADS Category 2.  Patient denies any personal or family history of breast cancer.  She denies any history of cardiac disease.  Per chart review, patient does have on her problem list a risk of QT prolongation.  She states that she has never had anyone tell her this.  She states that she does not see a cardiologist.  Patient reports she does not take any blood thinners.  Patient reports she is not a smoker.  Patient denies using any oral hormone replacement therapies or birth control.  She does state that she uses hormone creams.  She denies any history of miscarriages.  She denies any personal history of blood clots.  She does report that her aunt had a blood clot, she was unsure of what kind of blood clot she had.  Patient denies any personal or family history of clotting diseases.  She denies any recent surgeries, traumas or infections.  She denies any history of stroke or heart attack. She denies any history of Crohn's disease or ulcerative colitis, COPD or asthma.  She denies any history of cancer.  She denies any varicosities to her lower extremities.  She denies any recent fevers, chills or changes in her health.    Patient reports she is currently a 38C cup, and would like to be a B cup.  Discussed with the patient that cup size cannot be guaranteed.  Patient expressed understanding.  Summary  of Previous Visit: Patient was seen for initial consult by Dr. Lowery on 10/23/2023.  At this visit, patient reported upper back and neck pain due to her enlarged breasts.  On exam, her STN on the right was 27 cm and her STN on the left is 28 cm.  Her BMI was 26.5 kg/m.  Her preoperative bra size was a C cup.  The estimated amount of excess breast tissue to be removed at the time of surgery was 300-350 g each breast.  Patient was found to be a candidate for bilateral breast reduction with liposuction.  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, planning to take 1 week off  PMH Significant for: GERD, OCD, GAD, symptomatic mammary hypertrophy   Past Medical History: Allergies: Allergies  Allergen Reactions   Ciprofloxacin Hives   Ropinirole  Nausea Only    Current Medications:  Current Outpatient Medications:    cephALEXin (KEFLEX) 500 MG capsule, Take 1 capsule (500 mg total) by mouth 4 (four) times daily for 3 days., Disp: 12 capsule, Rfl: 0   Cholecalciferol (VITAMIN D ) 50 MCG (2000 UT) CAPS, Take by mouth., Disp: , Rfl:    DULoxetine (CYMBALTA) 30 MG capsule, Take 30 mg by mouth daily., Disp: , Rfl:    eszopiclone (LUNESTA) 1 MG TABS tablet, Take  1 tablet (1 mg total) by mouth at bedtime as needed for sleep. Take immediately before bedtime, Disp: 30 tablet, Rfl: 0   Glycerin (OPTASE COMFORT DRY EYE OP), Apply to eye., Disp: , Rfl:    MIEBO 1.338 GM/ML SOLN, 4 drops daily. Each eye, Disp: , Rfl:    Omega-3 Fatty Acids (FISH OIL) 1000 MG CAPS, Take by mouth., Disp: , Rfl:    oxyCODONE (ROXICODONE) 5 MG immediate release tablet, Take 1 tablet (5 mg total) by mouth every 6 (six) hours as needed for up to 15 doses for severe pain (pain score 7-10)., Disp: 15 tablet, Rfl: 0   pantoprazole  (PROTONIX ) 40 MG tablet, Take 1 tablet (40 mg total) by mouth daily before breakfast., Disp: 90 tablet, Rfl: 3    pravastatin  (PRAVACHOL ) 10 MG tablet, TAKE 1 TABLET BY MOUTH AT BEDTIME, Disp: 90 tablet, Rfl: 0   progesterone (PROMETRIUM) 100 MG capsule, Take 200 mg by mouth daily., Disp: , Rfl:    testosterone (ANDROGEL) 50 MG/5GM (1%) GEL, Place 5 g onto the skin daily., Disp: , Rfl:   Past Medical Problems: Past Medical History:  Diagnosis Date   Anxiety    Depression    GERD (gastroesophageal reflux disease)    Kidney stones 2015   twice   OCD (obsessive compulsive disorder)     Past Surgical History: Past Surgical History:  Procedure Laterality Date   CESAREAN SECTION  1996   CESAREAN SECTION  2001   COLONOSCOPY WITH PROPOFOL  N/A 03/24/2018   Procedure: COLONOSCOPY WITH PROPOFOL ;  Surgeon: Janalyn Keene NOVAK, MD;  Location: [facility];  Service: Endoscopy;  Laterality: N/A;   COSMETIC SURGERY  2002   Tummy tuck   ESOPHAGOGASTRODUODENOSCOPY (EGD) WITH PROPOFOL  N/A 03/24/2018   Procedure: ESOPHAGOGASTRODUODENOSCOPY (EGD) WITH PROPOFOL ;  Surgeon: Janalyn Keene NOVAK, MD;  Location: [facility];  Service: Endoscopy;  Laterality: N/A;   LITHOTRIPSY  2006 & 2008   PARTIAL HYSTERECTOMY  03/2021   TONSILLECTOMY  1990    Social History: Social History   Socioeconomic History   Marital status: Married    Spouse name: Not on file   Number of children: 2   Years of education: Not on file   Highest education level: Bachelor's degree (e.g., BA, AB, BS)  Occupational History   Occupation: Civil engineer, contracting   Occupation: Programme researcher, broadcasting/film/video   Tobacco Use   Smoking status: Never   Smokeless tobacco: Never  Vaping Use   Vaping status: Never Used  Substance and Sexual Activity   Alcohol use: No    Alcohol/week: 0.0 standard drinks of alcohol   Drug use: No   Sexual activity: Yes    Partners: Male    Birth control/protection: Other-see comments    Comment: Husband had a Vasectomy.  Other Topics Concern   Not on file  Social History Narrative   Not on file   Social  Drivers of Health   Financial Resource Strain: Medium Risk (03/21/2023)   Received from [facility]   Overall Financial Resource Strain (CARDIA)    Difficulty of Paying Living Expenses: Somewhat hard  Food Insecurity: No Food Insecurity (03/21/2023)   Received from [facility]   Hunger Vital Sign    Within the past 12 months, you worried that your food would run out before you got the money to buy more.: Never true    Within the past 12 months, the food you bought just didn't last and you didn't have  money to get more.: Never true  Transportation Needs: No Transportation Needs (03/21/2023)   Received from [facility] - Transportation    In the past 12 months, has lack of transportation kept you from medical appointments or from getting medications?: No    Lack of Transportation (Non-Medical): No  Physical Activity: Not on file  Stress: Not on file  Social Connections: Not on file  Intimate Partner Violence: Not on file    Family History: Family History  Problem Relation Age of Onset   Kidney Stones Mother    Hyperlipidemia Mother    Mental illness Mother    Hyperlipidemia Father    Hypertension Father    Cancer Father        Jaw tumor   Hypothyroidism Sister    Prostate cancer Maternal Uncle    Diabetes Maternal Uncle    Alcohol abuse Son    Bipolar disorder Son    Asthma Son    Suicidality Son    Colon cancer Neg Hx    Breast cancer Neg Hx     Review of Systems: Denies any recent fevers, chills or changes in her health  Physical Exam: Vital Signs BP 132/77   Pulse 87   LMP 08/29/2020 (Approximate)   SpO2 100%   Physical Exam  Constitutional:      General: Not in acute distress.    Appearance: Normal appearance. Not ill-appearing.  HENT:     Head: Normocephalic and atraumatic.  Eyes:     Pupils: Pupils are equal, round Neck:     Musculoskeletal: Normal range of motion.  Cardiovascular:      Rate and Rhythm: Normal rate Pulmonary:     Effort: Pulmonary effort is normal. No respiratory distress.  Musculoskeletal: Normal range of motion.  Skin:    General: Skin is warm and dry.     Findings: No erythema or rash.  Neurological:     Mental Status: Alert and oriented to person, place, and time. Mental status is at baseline.  Psychiatric:        Mood and Affect: Mood normal.        Behavior: Behavior normal.    Assessment/Plan: The patient is scheduled for bilateral breast reduction with Dr. Lowery.  Risks, benefits, and alternatives of procedure discussed, questions answered and consent obtained.    Will send clearance to patient's PCP and to behavioral health.  Smoking Status: Non-smoker; Counseling Given?  N/A Last Mammogram: 11/21/2022 results: BI-RADS Category 2: Benign -it appears patient has mammogram scheduled for 11/27/2023. -Discussed with the patient the importance of obtaining her mammogram prior to surgery.  Discussed with her that she will need to have this before surgery if we are to proceed.  She expressed understanding to this.  Caprini Score: 7; Risk Factors include: Age, BMI greater than 25, family history of thrombosis and length of planned surgery. Recommendation for mechanical and possible pharmacological prophylaxis. Encourage early ambulation.  Will discuss possibility of postoperative Lovenox with Dr. Lowery.  Pictures obtained: @consult   Post-op Rx sent to pharmacy: Keflex, oxycodone -given history of risk of QT prolongation, we will hold off on sending in Zofran for now.  Patient expressed understanding to this.  Instructed patient to hold any multivitamins or supplements at least 1 week prior to surgery.  Discussed with the patient that she should not take her Cymbalta on Lunesta at the same time as pain pills as these can be sedating.  Patient expressed understanding.  Patient  was provided with the breast reduction and General Surgical Risk  consent document and Pain Medication Agreement prior to their appointment.  They had adequate time to read through the risk consent documents and Pain Medication Agreement. We also discussed them in person together during this preop appointment. All of their questions were answered to their satisfaction.  Recommended calling if they have any further questions.  Risk consent form and Pain Medication Agreement to be scanned into patient's chart.  The risk that can be encountered with breast reduction were discussed and include the following but not limited to these:  Breast asymmetry, fluid accumulation, firmness of the breast, inability to breast feed, loss of nipple or areola, skin loss, decrease or no nipple sensation, fat necrosis of the breast tissue, bleeding, infection, healing delay.  There are risks of anesthesia, changes to skin sensation and injury to nerves or blood vessels.  The muscle can be temporarily or permanently injured.  You may have an allergic reaction to tape, suture, glue, blood products which can result in skin discoloration, swelling, pain, skin lesions, poor healing.  Any of these can lead to the need for revisonal surgery or stage procedures.  A reduction has potential to interfere with diagnostic procedures.  Nipple or breast piercing can increase risks of infection.  This procedure is best done when the breast is fully developed.  Changes in the breast will continue to occur over time.  Pregnancy can alter the outcomes of previous breast reduction surgery, weight gain and weigh loss can also effect the long term appearance.     Electronically signed by: Estefana FORBES Peck, PA-C 11/20/2023 4:08 PM

## 2023-11-20 ENCOUNTER — Ambulatory Visit: Admitting: Student

## 2023-11-20 ENCOUNTER — Other Ambulatory Visit: Payer: Self-pay | Admitting: Medical Genetics

## 2023-11-20 VITALS — BP 132/77 | HR 87

## 2023-11-20 DIAGNOSIS — N62 Hypertrophy of breast: Secondary | ICD-10-CM | POA: Diagnosis not present

## 2023-11-20 MED ORDER — CEPHALEXIN 500 MG PO CAPS: 500.0000 mg | ORAL_CAPSULE | Freq: Four times a day (QID) | ORAL | 0 refills | Status: AC

## 2023-11-20 MED ORDER — OXYCODONE HCL 5 MG PO TABS: 5.0000 mg | ORAL_TABLET | Freq: Four times a day (QID) | ORAL | 0 refills | Status: AC | PRN

## 2023-11-22 ENCOUNTER — Other Ambulatory Visit: Payer: Self-pay | Admitting: Psychiatry

## 2023-11-22 DIAGNOSIS — G4700 Insomnia, unspecified: Secondary | ICD-10-CM

## 2023-11-22 DIAGNOSIS — F429 Obsessive-compulsive disorder, unspecified: Secondary | ICD-10-CM

## 2023-11-22 DIAGNOSIS — F4310 Post-traumatic stress disorder, unspecified: Secondary | ICD-10-CM

## 2023-11-24 ENCOUNTER — Telehealth: Payer: Self-pay | Admitting: Student

## 2023-11-24 NOTE — Telephone Encounter (Signed)
 Per chart review, risk of QT prolongation due to medications was noted in a behavioral health note on 09/29/2023.  Patient called stating that she heard from her behavioral health office and they told her that patient was never prescribed the medication that caused QT prolongation.    Patient otherwise states that she sees behavioral health about once a month and speaks to a counselor about once a week.  She states that she feels that she has good support at home and from healthcare providers.  We did discuss that surgery could be mentally and emotionally taxing and we discussed the importance of having good support throughout the perioperative period.  Patient expressed understanding.

## 2023-11-26 ENCOUNTER — Other Ambulatory Visit: Payer: Self-pay | Admitting: Psychiatry

## 2023-11-26 DIAGNOSIS — G4701 Insomnia due to medical condition: Secondary | ICD-10-CM

## 2023-11-26 DIAGNOSIS — F4310 Post-traumatic stress disorder, unspecified: Secondary | ICD-10-CM

## 2023-11-27 ENCOUNTER — Ambulatory Visit
Admission: RE | Admit: 2023-11-27 | Discharge: 2023-11-27 | Disposition: A | Discharge status: Home / Self Care | Source: Ambulatory Visit | Attending: Family Medicine | Admitting: Family Medicine

## 2023-11-27 DIAGNOSIS — Z1231 Encounter for screening mammogram for malignant neoplasm of breast: Secondary | ICD-10-CM | POA: Insufficient documentation

## 2023-11-30 ENCOUNTER — Telehealth: Payer: Self-pay

## 2023-11-30 DIAGNOSIS — G4701 Insomnia due to medical condition: Secondary | ICD-10-CM

## 2023-11-30 MED ORDER — ESZOPICLONE 2 MG PO TABS: 2.0000 mg | ORAL_TABLET | Freq: Every evening | ORAL | 0 refills | Status: DC | PRN

## 2023-11-30 NOTE — Telephone Encounter (Signed)
 Medication refill - Patient left a message she is in need of a new Lunesta order for 2 a day. Patient reported when she started the medication, Dr. Eappen told her if she needed to, she could go up to 2 a night. Patient stated that is what she has been taking and has enough for 1 more night.  Patient requests a new order be sent into her pharmacy for 1 mg, 2 at bedtime.  Patient's next appointment is 12/18/23.

## 2023-11-30 NOTE — Telephone Encounter (Signed)
 I have sent Lunesta with a dosage increase to 2 mg to pharmacy.

## 2023-12-01 NOTE — Telephone Encounter (Signed)
 Called patient to inform of the Lunesta with a 2 mg increase has been sent to her pharmacy patient voiced understanding

## 2023-12-03 ENCOUNTER — Encounter (HOSPITAL_BASED_OUTPATIENT_CLINIC_OR_DEPARTMENT_OTHER): Payer: Self-pay | Admitting: Plastic Surgery

## 2023-12-03 ENCOUNTER — Other Ambulatory Visit: Payer: Self-pay

## 2023-12-04 ENCOUNTER — Telehealth: Admitting: Psychiatry

## 2023-12-09 ENCOUNTER — Other Ambulatory Visit: Payer: Self-pay | Admitting: Family Medicine

## 2023-12-09 DIAGNOSIS — K219 Gastro-esophageal reflux disease without esophagitis: Secondary | ICD-10-CM

## 2023-12-10 ENCOUNTER — Other Ambulatory Visit: Payer: Self-pay | Admitting: Student

## 2023-12-10 ENCOUNTER — Ambulatory Visit (HOSPITAL_BASED_OUTPATIENT_CLINIC_OR_DEPARTMENT_OTHER): Admitting: Anesthesiology

## 2023-12-10 ENCOUNTER — Telehealth: Payer: Self-pay | Admitting: Plastic Surgery

## 2023-12-10 ENCOUNTER — Encounter (HOSPITAL_BASED_OUTPATIENT_CLINIC_OR_DEPARTMENT_OTHER): Admission: RE | Disposition: A | Payer: Self-pay | Source: Home / Self Care | Attending: Plastic Surgery

## 2023-12-10 ENCOUNTER — Other Ambulatory Visit: Payer: Self-pay

## 2023-12-10 ENCOUNTER — Encounter (HOSPITAL_BASED_OUTPATIENT_CLINIC_OR_DEPARTMENT_OTHER): Payer: Self-pay | Admitting: Plastic Surgery

## 2023-12-10 ENCOUNTER — Ambulatory Visit (HOSPITAL_BASED_OUTPATIENT_CLINIC_OR_DEPARTMENT_OTHER)
Admission: RE | Admit: 2023-12-10 | Discharge: 2023-12-10 | Disposition: A | Discharge status: Home / Self Care | Attending: Plastic Surgery | Admitting: Plastic Surgery

## 2023-12-10 DIAGNOSIS — M549 Dorsalgia, unspecified: Secondary | ICD-10-CM | POA: Diagnosis not present

## 2023-12-10 DIAGNOSIS — N62 Hypertrophy of breast: Secondary | ICD-10-CM | POA: Insufficient documentation

## 2023-12-10 DIAGNOSIS — K219 Gastro-esophageal reflux disease without esophagitis: Secondary | ICD-10-CM | POA: Diagnosis not present

## 2023-12-10 DIAGNOSIS — F419 Anxiety disorder, unspecified: Secondary | ICD-10-CM | POA: Diagnosis not present

## 2023-12-10 DIAGNOSIS — F32A Depression, unspecified: Secondary | ICD-10-CM | POA: Insufficient documentation

## 2023-12-10 DIAGNOSIS — M542 Cervicalgia: Secondary | ICD-10-CM | POA: Diagnosis not present

## 2023-12-10 HISTORY — PX: BREAST REDUCTION SURGERY: SHX8

## 2023-12-10 SURGERY — BREAST REDUCTION WITH LIPOSUCTION
Anesthesia: General | Site: Breast | Laterality: Bilateral

## 2023-12-10 MED ORDER — EPINEPHRINE PF 1 MG/ML IJ SOLN
INTRAMUSCULAR | Status: AC
  Filled 2023-12-10: qty 2

## 2023-12-10 MED ORDER — VASHE WOUND IRRIGATION OPTIME
TOPICAL | Status: DC | PRN
  Administered 2023-12-10: 34 [oz_av]

## 2023-12-10 MED ORDER — CEFAZOLIN SODIUM-DEXTROSE 2-4 GM/100ML-% IV SOLN
INTRAVENOUS | Status: AC
Start: 2023-12-10 — End: 2023-12-10
  Filled 2023-12-10: qty 100

## 2023-12-10 MED ORDER — PROPOFOL 1000 MG/100ML IV EMUL
INTRAVENOUS | Status: AC
  Filled 2023-12-10: qty 200

## 2023-12-10 MED ORDER — SCOPOLAMINE 1 MG/3DAYS TD PT72
MEDICATED_PATCH | TRANSDERMAL | Status: AC
  Filled 2023-12-10: qty 1

## 2023-12-10 MED ORDER — CHLORHEXIDINE GLUCONATE CLOTH 2 % EX PADS: 6.0000 | MEDICATED_PAD | Freq: Once | CUTANEOUS | Status: DC

## 2023-12-10 MED ORDER — DROPERIDOL 2.5 MG/ML IJ SOLN: 0.6250 mg | Freq: Once | INTRAMUSCULAR | Status: DC | PRN

## 2023-12-10 MED ORDER — LIDOCAINE HCL 1 % IJ SOLN
INTRAVENOUS | Status: DC | PRN
  Administered 2023-12-10: 450 mL

## 2023-12-10 MED ORDER — BUPIVACAINE LIPOSOME 1.3 % IJ SUSP
INTRAMUSCULAR | Status: DC | PRN
  Administered 2023-12-10: 40 mL

## 2023-12-10 MED ORDER — DEXAMETHASONE SODIUM PHOSPHATE 4 MG/ML IJ SOLN
INTRAMUSCULAR | Status: DC | PRN
  Administered 2023-12-10: 8 mg via INTRAVENOUS

## 2023-12-10 MED ORDER — ONDANSETRON HCL 4 MG/2ML IJ SOLN
INTRAMUSCULAR | Status: DC | PRN
  Administered 2023-12-10: 80 mg via INTRAVENOUS

## 2023-12-10 MED ORDER — BUPIVACAINE LIPOSOME 1.3 % IJ SUSP
INTRAMUSCULAR | Status: AC
  Filled 2023-12-10: qty 20

## 2023-12-10 MED ORDER — CEFAZOLIN SODIUM-DEXTROSE 2-4 GM/100ML-% IV SOLN
2.0000 g | INTRAVENOUS | Status: AC
Start: 2023-12-10 — End: 2023-12-10
  Administered 2023-12-10: 2 g via INTRAVENOUS

## 2023-12-10 MED ORDER — PROPOFOL 500 MG/50ML IV EMUL
INTRAVENOUS | Status: DC | PRN
Start: 2023-12-10 — End: 2023-12-10
  Administered 2023-12-10: 150 ug/kg/min via INTRAVENOUS

## 2023-12-10 MED ORDER — BUPIVACAINE HCL (PF) 0.25 % IJ SOLN
INTRAMUSCULAR | Status: AC
  Filled 2023-12-10: qty 150

## 2023-12-10 MED ORDER — ACETAMINOPHEN 500 MG PO TABS
1000.0000 mg | ORAL_TABLET | Freq: Once | ORAL | Status: AC
  Administered 2023-12-10: 1000 mg via ORAL

## 2023-12-10 MED ORDER — FENTANYL CITRATE (PF) 100 MCG/2ML IJ SOLN
INTRAMUSCULAR | Status: DC | PRN
  Administered 2023-12-10 (×2): 50 ug via INTRAVENOUS

## 2023-12-10 MED ORDER — FENTANYL CITRATE (PF) 100 MCG/2ML IJ SOLN
INTRAMUSCULAR | Status: AC
  Filled 2023-12-10: qty 2

## 2023-12-10 MED ORDER — DEXMEDETOMIDINE HCL IN NACL 80 MCG/20ML IV SOLN
INTRAVENOUS | Status: AC
  Filled 2023-12-10: qty 40

## 2023-12-10 MED ORDER — NITROGLYCERIN 2 % TD OINT
TOPICAL_OINTMENT | TRANSDERMAL | Status: AC
  Filled 2023-12-10: qty 30

## 2023-12-10 MED ORDER — DEXMEDETOMIDINE HCL IN NACL 80 MCG/20ML IV SOLN
INTRAVENOUS | Status: DC | PRN
  Administered 2023-12-10 (×2): 4 ug via INTRAVENOUS

## 2023-12-10 MED ORDER — HYDROMORPHONE HCL 1 MG/ML IJ SOLN: 0.2500 mg | INTRAMUSCULAR | Status: DC | PRN

## 2023-12-10 MED ORDER — ONDANSETRON HCL 4 MG PO TABS: 4.0000 mg | ORAL_TABLET | Freq: Three times a day (TID) | ORAL | 0 refills | Status: AC | PRN

## 2023-12-10 MED ORDER — ACETAMINOPHEN 500 MG PO TABS
ORAL_TABLET | ORAL | Status: AC
  Filled 2023-12-10: qty 2

## 2023-12-10 MED ORDER — LIDOCAINE HCL (CARDIAC) PF 100 MG/5ML IV SOSY
PREFILLED_SYRINGE | INTRAVENOUS | Status: DC | PRN
  Administered 2023-12-10: 80 mg via INTRAVENOUS

## 2023-12-10 MED ORDER — MIDAZOLAM HCL 2 MG/2ML IJ SOLN
INTRAMUSCULAR | Status: AC
  Filled 2023-12-10: qty 2

## 2023-12-10 MED ORDER — PROPOFOL 500 MG/50ML IV EMUL
INTRAVENOUS | Status: AC
  Filled 2023-12-10: qty 100

## 2023-12-10 MED ORDER — LACTATED RINGERS IV SOLN: INTRAVENOUS | Status: DC

## 2023-12-10 MED ORDER — PROPOFOL 10 MG/ML IV BOLUS
INTRAVENOUS | Status: DC | PRN
  Administered 2023-12-10: 120 mg via INTRAVENOUS

## 2023-12-10 MED ORDER — LIDOCAINE HCL (PF) 1 % IJ SOLN
INTRAMUSCULAR | Status: AC
  Filled 2023-12-10: qty 120

## 2023-12-10 MED ORDER — SCOPOLAMINE 1 MG/3DAYS TD PT72
1.0000 | MEDICATED_PATCH | TRANSDERMAL | Status: DC
  Administered 2023-12-10: 1 mg via TRANSDERMAL

## 2023-12-10 MED ORDER — EPHEDRINE SULFATE (PRESSORS) 25 MG/5ML IV SOSY
PREFILLED_SYRINGE | INTRAVENOUS | Status: DC | PRN
  Administered 2023-12-10 (×2): 5 mg via INTRAVENOUS

## 2023-12-10 MED ORDER — NITROGLYCERIN 2 % TD OINT
TOPICAL_OINTMENT | TRANSDERMAL | Status: DC | PRN
  Administered 2023-12-10: 1 [in_us] via TOPICAL

## 2023-12-10 MED ORDER — MIDAZOLAM HCL (PF) 2 MG/2ML IJ SOLN
INTRAMUSCULAR | Status: DC | PRN
  Administered 2023-12-10: 2 mg via INTRAVENOUS

## 2023-12-10 MED ORDER — PHENYLEPHRINE HCL (PRESSORS) 10 MG/ML IV SOLN
INTRAVENOUS | Status: DC | PRN
  Administered 2023-12-10: 160 ug via INTRAVENOUS
  Administered 2023-12-10 (×2): 80 ug via INTRAVENOUS

## 2023-12-10 MED ORDER — LIDOCAINE-EPINEPHRINE 1 %-1:100000 IJ SOLN
INTRAMUSCULAR | Status: DC | PRN
  Administered 2023-12-10: 30 mL

## 2023-12-10 SURGICAL SUPPLY — 66 items
BINDER BREAST LRG (GAUZE/BANDAGES/DRESSINGS) IMPLANT
BINDER BREAST MEDIUM (GAUZE/BANDAGES/DRESSINGS) IMPLANT
BINDER BREAST XLRG (GAUZE/BANDAGES/DRESSINGS) IMPLANT
BINDER BREAST XXLRG (GAUZE/BANDAGES/DRESSINGS) IMPLANT
BIOPATCH RED 1 DISK 7.0 (GAUZE/BANDAGES/DRESSINGS) IMPLANT
BLADE HEX COATED 2.75 (ELECTRODE) IMPLANT
BLADE KNIFE PERSONA 10 (BLADE) ×2 IMPLANT
BLADE SURG 15 STRL LF DISP TIS (BLADE) ×1 IMPLANT
CANISTER SUCT 1200ML W/VALVE (MISCELLANEOUS) ×1 IMPLANT
CLEANSER WND VASHE 34 (WOUND CARE) ×1 IMPLANT
COTTONBALL LRG STERILE PKG (GAUZE/BANDAGES/DRESSINGS) IMPLANT
COVER BACK TABLE 60X90IN (DRAPES) ×1 IMPLANT
COVER MAYO STAND STRL (DRAPES) ×1 IMPLANT
DERMABOND ADVANCED .7 DNX12 (GAUZE/BANDAGES/DRESSINGS) ×2 IMPLANT
DRAIN CHANNEL 15F RND FF W/TCR (WOUND CARE) IMPLANT
DRAIN CHANNEL 19F RND (DRAIN) IMPLANT
DRAPE LAPAROSCOPIC ABDOMINAL (DRAPES) ×1 IMPLANT
DRAPE UTILITY XL STRL (DRAPES) ×1 IMPLANT
DRSG MEPILEX POST OP 4X8 (GAUZE/BANDAGES/DRESSINGS) ×2 IMPLANT
DRSG TEGADERM 4X4.75 (GAUZE/BANDAGES/DRESSINGS) IMPLANT
DRSG TELFA 3X8 NADH STRL (GAUZE/BANDAGES/DRESSINGS) IMPLANT
ELECTRODE BLDE 4.0 EZ CLN MEGD (MISCELLANEOUS) ×1 IMPLANT
ELECTRODE REM PT RTRN 9FT ADLT (ELECTROSURGICAL) ×1 IMPLANT
EVACUATOR SILICONE 100CC (DRAIN) IMPLANT
GAUZE PAD ABD 8X10 STRL (GAUZE/BANDAGES/DRESSINGS) ×2 IMPLANT
GAUZE XEROFORM 5X9 LF (GAUZE/BANDAGES/DRESSINGS) IMPLANT
GLOVE BIO SURGEON STRL SZ 6.5 (GLOVE) ×2 IMPLANT
GLOVE BIO SURGEON STRL SZ7.5 (GLOVE) IMPLANT
GLOVE BIOGEL PI IND STRL 7.0 (GLOVE) IMPLANT
GLOVE BIOGEL PI IND STRL 8 (GLOVE) IMPLANT
GOWN STRL REUS W/ TWL LRG LVL3 (GOWN DISPOSABLE) ×2 IMPLANT
GOWN STRL REUS W/ TWL XL LVL3 (GOWN DISPOSABLE) IMPLANT
LINER CANISTER 1000CC FLEX (MISCELLANEOUS) ×1 IMPLANT
NDL FILTER BLUNT 18X1 1/2 (NEEDLE) IMPLANT
NDL HYPO 25X1 1.5 SAFETY (NEEDLE) ×2 IMPLANT
NEEDLE FILTER BLUNT 18X1 1/2 (NEEDLE) ×1 IMPLANT
NEEDLE HYPO 25X1 1.5 SAFETY (NEEDLE) ×2 IMPLANT
PACK BASIN DAY SURGERY FS (CUSTOM PROCEDURE TRAY) ×1 IMPLANT
PAD ALCOHOL SWAB (MISCELLANEOUS) IMPLANT
PAD FOAM SILICONE BACKED (GAUZE/BANDAGES/DRESSINGS) IMPLANT
PENCIL SMOKE EVACUATOR (MISCELLANEOUS) ×1 IMPLANT
PIN SAFETY STERILE (MISCELLANEOUS) IMPLANT
POWDER MYRIAD MORCLLS FINE 500 (Miscellaneous) IMPLANT
SLEEVE SCD COMPRESS KNEE MED (STOCKING) ×1 IMPLANT
SOL PREP POV-IOD 4OZ 10% (MISCELLANEOUS) ×1 IMPLANT
SOLN 0.9% NACL POUR BTL 1000ML (IV SOLUTION) IMPLANT
SPIKE FLUID TRANSFER (MISCELLANEOUS) IMPLANT
SPONGE T-LAP 18X18 ~~LOC~~+RFID (SPONGE) ×2 IMPLANT
STRIP SUTURE WOUND CLOSURE 1/2 (MISCELLANEOUS) ×4 IMPLANT
SUT MNCRL AB 4-0 PS2 18 (SUTURE) ×4 IMPLANT
SUT MON AB 3-0 SH27 (SUTURE) ×4 IMPLANT
SUT MON AB 5-0 PS2 18 (SUTURE) IMPLANT
SUT PDS II 3-0 CT2 27 ABS (SUTURE) ×4 IMPLANT
SUT SILK 3 0 PS 1 (SUTURE) IMPLANT
SUT VIC AB 4-0 PS2 27 (SUTURE) IMPLANT
SYR 50ML LL SCALE MARK (SYRINGE) IMPLANT
SYR BULB IRRIG 60ML STRL (SYRINGE) ×1 IMPLANT
SYR CONTROL 10ML LL (SYRINGE) ×2 IMPLANT
TAPE MEASURE VINYL STERILE (MISCELLANEOUS) IMPLANT
TOWEL GREEN STERILE FF (TOWEL DISPOSABLE) ×3 IMPLANT
TRAY DSU PREP LF (CUSTOM PROCEDURE TRAY) ×1 IMPLANT
TUBE CONNECTING 20X1/4 (TUBING) ×1 IMPLANT
TUBING INFILTRATION IT-10001 (TUBING) IMPLANT
TUBING SET GRADUATE ASPIR 12FT (MISCELLANEOUS) IMPLANT
UNDERPAD 30X36 HEAVY ABSORB (UNDERPADS AND DIAPERS) ×2 IMPLANT
YANKAUER SUCT BULB TIP NO VENT (SUCTIONS) ×1 IMPLANT

## 2023-12-10 NOTE — Telephone Encounter (Signed)
 Requested Prescriptions  Pending Prescriptions Disp Refills   pantoprazole  (PROTONIX ) 40 MG tablet [Pharmacy Med Name: Pantoprazole  Sodium 40 MG Oral Tablet Delayed Release] 30 tablet 0    Sig: TAKE 1 TABLET BY MOUTH ONCE DAILY BEFORE BREAKFAST     Gastroenterology: Proton Pump Inhibitors Passed - 12/10/2023  4:43 PM      Passed - Valid encounter within last 12 months    Recent Outpatient Visits           4 months ago Other obsessive-compulsive disorders   [facility] [place] Marsa PARAS, DO   7 months ago Peripheral polyneuropathy   [place] [facility] [place], Marsa PARAS, [place]

## 2023-12-10 NOTE — Progress Notes (Signed)
 Received message from clinical staff stating that patient is nauseous and is requesting antiemetics.  Given patient is not taking QT prolonging medications and per chart review does not have the risk of QT prolongation, we will go ahead and send in Zofran.

## 2023-12-10 NOTE — Anesthesia Preprocedure Evaluation (Signed)
 Anesthesia Evaluation  Patient identified by MRN, date of birth, ID band Patient awake    Reviewed: Allergy & Precautions, NPO status , Patient's Chart, lab work & pertinent test results  History of Anesthesia Complications Negative for: history of anesthetic complications  Airway Mallampati: II  TM Distance: >3 FB Neck ROM: Full    Dental no notable dental hx. (+) Teeth Intact   Pulmonary neg pulmonary ROS, neg sleep apnea, neg COPD, Patient abstained from smoking.Not current smoker   Pulmonary exam normal breath sounds clear to auscultation       Cardiovascular Exercise Tolerance: Good METS(-) hypertension(-) CAD and (-) Past MI negative cardio ROS (-) dysrhythmias  Rhythm:Regular Rate:Normal - Systolic murmurs    Neuro/Psych  PSYCHIATRIC DISORDERS Anxiety Depression    negative neurological ROS     GI/Hepatic ,GERD  Medicated and Controlled,,(+)     (-) substance abuse    Endo/Other  neg diabetes    Renal/GU negative Renal ROS     Musculoskeletal   Abdominal   Peds  Hematology   Anesthesia Other Findings Past Medical History: No date: Anxiety No date: Depression No date: GERD (gastroesophageal reflux disease) 2015: Kidney stones     Comment:  twice No date: OCD (obsessive compulsive disorder)  Reproductive/Obstetrics                              Anesthesia Physical Anesthesia Plan  ASA: 2  Anesthesia Plan: General   Post-op Pain Management: Tylenol PO (pre-op)*   Induction: Intravenous  PONV Risk Score and Plan: 3 and Ondansetron, Dexamethasone and Midazolam  Airway Management Planned: LMA  Additional Equipment: None  Intra-op Plan:   Post-operative Plan: Extubation in OR  Informed Consent: I have reviewed the patients History and Physical, chart, labs and discussed the procedure including the risks, benefits and alternatives for the proposed anesthesia with the  patient or authorized representative who has indicated his/her understanding and acceptance.     Dental advisory given  Plan Discussed with: CRNA and Surgeon  Anesthesia Plan Comments: (Discussed risks of anesthesia with patient, including PONV, sore throat, lip/dental/eye damage. Rare risks discussed as well, such as cardiorespiratory and neurological sequelae, and allergic reactions. Discussed the role of CRNA in patient's perioperative care. Patient understands.)        Anesthesia Quick Evaluation

## 2023-12-10 NOTE — Interval H&P Note (Signed)
 History and Physical Interval Note:  12/10/2023 7:07 AM  Gloria Harrison  has presented today for surgery, with the diagnosis of n62.  The various methods of treatment have been discussed with the patient and family. After consideration of risks, benefits and other options for treatment, the patient has consented to  Procedure(s): BREAST REDUCTION WITH LIPOSUCTION (Bilateral) as a surgical intervention.  The patient's history has been reviewed, patient examined, no change in status, stable for surgery.  I have reviewed the patient's chart and labs.  Questions were answered to the patient's satisfaction.     [name] Orest Dygert

## 2023-12-10 NOTE — Op Note (Signed)
 Breast Reduction Op note:    DATE OF PROCEDURE: 12/10/2023  LOCATION: [facility]  SURGEON: Estefana Fritter, DO  ASSISTANT: Honora Seip, PA  PREOPERATIVE DIAGNOSIS 1. Macromastia 2. Neck Pain 3. Back Pain  POSTOPERATIVE DIAGNOSIS 1. Macromastia 2. Neck Pain 3. Back Pain  PROCEDURES 1. Bilateral breast reduction.  Right reduction 422 g, Left reduction 440 g  COMPLICATIONS: None.  DRAINS: none  INDICATIONS FOR PROCEDURE Gloria Harrison is a 58 y.o. year-old female born on 18-Mar-1965,with a history of symptomatic macromastia with concomitant back pain, neck pain, shoulder grooving from her bra.   MRN: [id number]  CONSENT Informed consent was obtained directly from the patient. The risks, benefits and alternatives were fully discussed. Specific risks including but not limited to bleeding, infection, hematoma, seroma, scarring, pain, nipple necrosis, asymmetry, poor cosmetic results, and need for further surgery were discussed. The patient's questions were answered.  DESCRIPTION OF PROCEDURE  Patient was brought into the operating room and rested on the operating room table in the supine position.  SCDs were placed and appropriate padding was performed.  Antibiotics were given. The patient underwent general anesthesia and the chest was prepped and draped in a sterile fashion.  A timeout was performed and all information was confirmed to be correct by those in the room. Tumescent was placed in the lateral breasts and liposuction done laterally.  Right side: Preoperative markings were confirmed.  Incision lines were injected with local containing epinephrine.  After waiting for vasoconstriction, the marked lines were incised with a #15 blade.  A Wise-pattern superomedial breast reduction was performed by de-epithelializing the pedicle, using bovie to create the superomedial pedicle, and removing breast tissue from the superior, lateral, and inferior portions of  the breast.  Myriad and Experel placed in the pocket. Care was taken to not undermine the breast pedicle. Hemostasis was achieved.  The nipple was gently rotated into position and the soft tissue closed with 4-0 Monocryl.   The pocket was irrigated and hemostasis confirmed.  The deep tissues were approximated with 3-0 PDS sutures.  The skin was closed with deep dermal 3-0 Monocryl and subcuticular 4-0 Monocryl sutures.  The nipple and skin flaps had good capillary refill at the end of the procedure.    Left side: Preoperative markings were confirmed.  Incision lines were injected with local containing epinephrine.  After waiting for vasoconstriction, the marked lines were incised with a #15 blade.  A Wise-pattern superomedial breast reduction was performed by de-epithelializing the pedicle, using bovie to create the superomedial pedicle, and removing breast tissue from the superior, lateral, and inferior portions of the breast.  Care was taken to not undermine the breast pedicle. Myriad and Experel placed in the pocket. Hemostasis was achieved.  The nipple was gently rotated into position and the soft tissue was closed with 4-0 Monocryl.  The patient was sat upright and size and shape symmetry was confirmed.  The pocket was irrigated and hemostasis confirmed.  The deep tissues were approximated with 3-0 PDS sutures. The skin was closed with deep dermal 3-0 Monocryl and subcuticular 4-0 Monocryl sutures.  Dermabond was applied.  A breast binder and ABDs were placed.  The nipple and skin flaps had good capillary refill at the end of the procedure.  The patient tolerated the procedure well. The patient was allowed to wake from anesthesia and taken to the recovery room in satisfactory condition.  The advanced practice practitioner (APP) assisted throughout the case.  The APP was essential  in retraction and counter traction when needed to make the case progress smoothly.  This retraction and assistance made it  possible to see the tissue plans for the procedure.  The assistance was needed for blood control, tissue re-approximation and assisted with closure of the incision site.

## 2023-12-10 NOTE — Anesthesia Procedure Notes (Signed)
 Procedure Name: LMA Insertion Date/Time: 12/10/2023 7:44 AM  Performed by: Donnell Berwyn SQUIBB, CRNAPre-anesthesia Checklist: Patient identified, Emergency Drugs available, Suction available, Patient being monitored and Timeout performed Patient Re-evaluated:Patient Re-evaluated prior to induction Oxygen Delivery Method: Circle system utilized Preoxygenation: Pre-oxygenation with 100% oxygen Induction Type: IV induction Ventilation: Mask ventilation without difficulty LMA: LMA inserted LMA Size: 4.0 Number of attempts: 1 Placement Confirmation: positive ETCO2 and breath sounds checked- equal and bilateral Tube secured with: Tape Dental Injury: Teeth and Oropharynx as per pre-operative assessment

## 2023-12-10 NOTE — Telephone Encounter (Signed)
 Medications have been sent in for her

## 2023-12-10 NOTE — Anesthesia Postprocedure Evaluation (Signed)
 Anesthesia Post Note  Patient: Gloria Harrison  Procedure(s) Performed: BREAST REDUCTION WITH LIPOSUCTION (Bilateral: Breast)     Patient location during evaluation: PACU Anesthesia Type: General Level of consciousness: awake and alert Pain management: pain level controlled Vital Signs Assessment: post-procedure vital signs reviewed and stable Respiratory status: spontaneous breathing, nonlabored ventilation, respiratory function stable and patient connected to nasal cannula oxygen Cardiovascular status: blood pressure returned to baseline and stable Postop Assessment: no apparent nausea or vomiting Anesthetic complications: no   No notable events documented.  Last Vitals:  Vitals:   12/10/23 1009 12/10/23 1029  BP:  (!) 112/55  Pulse: 84 89  Resp: 18 16  Temp:  36.6 C  SpO2: 97% 94%    Last Pain:  Vitals:   12/10/23 1029  TempSrc:   PainSc: 0-No pain                 Rome Ade

## 2023-12-10 NOTE — Discharge Instructions (Addendum)
 No tylenol before 3pm.   INSTRUCTIONS FOR AFTER BREAST SURGERY   You will likely have some questions about what to expect following your operation.  The following information will help you and your family understand what to expect when you are discharged from the hospital.  It is important to follow these guidelines to help ensure a smooth recovery and reduce complication.  Postoperative instructions include information on: diet, wound care, medications and physical activity.  AFTER SURGERY Expect to go home after the procedure.  In some cases, you may need to spend one night in the hospital for observation.  DIET Breast surgery does not require a specific diet.  However, the healthier you eat the better your body will heal. It is important to increasing your protein intake.  This means limiting the foods with sugar and carbohydrates.  Focus on vegetables and some meat.  If you have liposuction during your procedure be sure to drink water.  If your urine is bright yellow, then it is concentrated, and you need to drink more water.  As a general rule after surgery, you should have 8 ounces of water every hour while awake.  If you find you are persistently nauseated or unable to take in liquids let us  know.  NO TOBACCO USE or EXPOSURE.  This will slow your healing process and lead to a wound.  WOUND CARE Leave the binder on for 3 days . Use fragrance free soap like Dial, Dove or Ivory.   After 3 days you can remove the binder to shower. Once dry apply binder or sports bra. If you have liposuction you will have a soft and spongy dressing (Lipofoam) that helps prevent creases in your skin.  Remove before you shower and then replace it.  It is also available on [employer]. If you have steri-strips / tape directly attached to your skin leave them in place. It is OK to get these wet.   No baths, pools or hot tubs for four weeks. We close your incision to leave the smallest and best-looking scar. No ointment  or creams on your incisions for four weeks.  No Neosporin (Too many skin reactions).  A few weeks after surgery you can use Mederma and start massaging the scar. We ask you to wear your binder or sports bra for the first 6 weeks around the clock, including while sleeping. This provides added comfort and helps reduce the fluid accumulation at the surgery site. NO Ice or heating pads to the operative site.  You have a very high risk of a BURN before you feel the temperature change.  ACTIVITY No heavy lifting until cleared by the doctor.  This usually means no more than a half-gallon of milk.  It is OK to walk and climb stairs. Moving your legs is very important to decrease your risk of a blood clot.  It will also help keep you from getting deconditioned.  Every 1 to 2 hours get up and walk for 5 minutes. This will help with a quicker recovery back to normal.  Let pain be your guide so you don't do too much.  This time is for you to recover.  You will be more comfortable if you sleep and rest with your head elevated either with a few pillows under you or in a recliner.  No stomach sleeping for a three months.  WORK Everyone returns to work at different times. As a rough guide, most people take at least 1 - 2 weeks off  prior to returning to work. If you need documentation for your job, give the forms to the front staff at the clinic.  DRIVING Arrange for someone to bring you home from the hospital after your surgery.  You may be able to drive a few days after surgery but not while taking any narcotics or valium.  BOWEL MOVEMENTS Constipation can occur after anesthesia and while taking pain medication.  It is important to stay ahead for your comfort.  We recommend taking Milk of Magnesia (2 tablespoons; twice a day) while taking the pain pills.  MEDICATIONS You may be prescribed should start after surgery At your preoperative visit for you history and physical you were given the following  medications: Antibiotic: Start this medication when you get home and take according to the instructions on the bottle. Zofran 4 mg:  This is to treat nausea and vomiting.  You can take this every 6 hours as needed and only if needed. Oxycodone 5 mg every 6 hours for 3 - 5 days.  This is to be used after you have taken the Motrin or the Tylenol. 4.   Gabapentin  300 mg every 12 hours for 7 days.  Over the counter Medication to take: Ibuprofen (Motrin) 400 - 600 mg every 6 hour for 7 days Tylenol 500 mg every 6 hours for 7 days.  Only take the Oxycodone after you have tried these two. MiraLAX  or stool softener of choice: Take this according to the bottle if you take the Norco.  If muscle work done:  Flexeril 5 mg every 12 hours for 7 days.  WHEN TO CALL Call your surgeon's office if any of the following occur: Fever 101 degrees F or greater Excessive bleeding or fluid from the incision site. Pain that increases over time without aid from the medications Redness, warmth, or pus draining from incision sites Persistent nausea or inability to take in liquids Severe misshapen area that underwent the operation.  Information for Discharge Teaching: EXPAREL (bupivacaine liposome injectable suspension)   Pain relief is important to your recovery. The goal is to control your pain so you can move easier and return to your normal activities as soon as possible after your procedure. Your physician may use several types of medicines to manage pain, swelling, and more.  Your surgeon or anesthesiologist gave you EXPAREL(bupivacaine) to help control your pain after surgery.  EXPAREL is a local anesthetic designed to release slowly over an extended period of time to provide pain relief by numbing the tissue around the surgical site. EXPAREL is designed to release pain medication over time and can control pain for up to 72 hours. Depending on how you respond to EXPAREL, you may require less pain medication  during your recovery. EXPAREL can help reduce or eliminate the need for opioids during the first few days after surgery when pain relief is needed the most. EXPAREL is not an opioid and is not addictive. It does not cause sleepiness or sedation.   Important! A teal colored band has been placed on your arm with the date, time and amount of EXPAREL you have received. Please leave this armband in place for the full 96 hours following administration, and then you may remove the band. If you return to the hospital for any reason within 96 hours following the administration of EXPAREL, the armband provides important information that your health care providers to know, and alerts them that you have received this anesthetic.    Possible side effects of  EXPAREL: Temporary loss of sensation or ability to move in the area where medication was injected. Nausea, vomiting, constipation Rarely, numbness and tingling in your mouth or lips, lightheadedness, or anxiety may occur. Call your doctor right away if you think you may be experiencing any of these sensations, or if you have other questions regarding possible side effects.  Follow all other discharge instructions given to you by your surgeon or nurse. Eat a healthy diet and drink plenty of water or other fluids.   Post Anesthesia Home Care Instructions  Activity: Get plenty of rest for the remainder of the day. A responsible individual must stay with you for 24 hours following the procedure.  For the next 24 hours, DO NOT: -Drive a car -Advertising copywriter -Drink alcoholic beverages -Take any medication unless instructed by your physician -Make any legal decisions or sign important papers.  Meals: Start with liquid foods such as gelatin or soup. Progress to regular foods as tolerated. Avoid greasy, spicy, heavy foods. If nausea and/or vomiting occur, drink only clear liquids until the nausea and/or vomiting subsides. Call your physician if vomiting  continues.  Special Instructions/Symptoms: Your throat may feel dry or sore from the anesthesia or the breathing tube placed in your throat during surgery. If this causes discomfort, gargle with warm salt water. The discomfort should disappear within 24 hours.  If you had a scopolamine patch placed behind your ear for the management of post- operative nausea and/or vomiting:  1. The medication in the patch is effective for 72 hours, after which it should be removed.  Wrap patch in a tissue and discard in the trash. Wash hands thoroughly with soap and water. 2. You may remove the patch earlier than 72 hours if you experience unpleasant side effects which may include dry mouth, dizziness or visual disturbances. 3. Avoid touching the patch. Wash your hands with soap and water after contact with the patch.

## 2023-12-10 NOTE — Telephone Encounter (Signed)
 Pt is nauseous and wanted to know if she can having anything for it. Please advise patient had sx today bil reduction / Ins / sx 12-10-23 [facility] 0730

## 2023-12-10 NOTE — Transfer of Care (Signed)
 Immediate Anesthesia Transfer of Care Note  Patient: Gloria Harrison  Procedure(s) Performed: BREAST REDUCTION WITH LIPOSUCTION (Bilateral: Breast)  Patient Location: PACU  Anesthesia Type:General  Level of Consciousness: awake, alert , and patient cooperative  Airway & Oxygen Therapy: Patient Spontanous Breathing and Patient connected to nasal cannula oxygen  Post-op Assessment: Report given to RN and Post -op Vital signs reviewed and stable  Post vital signs: Reviewed and stable  Last Vitals:  Vitals Value Taken Time  BP 108/68 12/10/23 09:30  Temp    Pulse 88 12/10/23 09:31  Resp 17 12/10/23 09:31  SpO2 98 % 12/10/23 09:31  Vitals shown include unfiled device data.  Last Pain:  Vitals:   12/10/23 0651  TempSrc: Temporal  PainSc: 0-No pain         Complications: No notable events documented.

## 2023-12-11 ENCOUNTER — Encounter (HOSPITAL_BASED_OUTPATIENT_CLINIC_OR_DEPARTMENT_OTHER): Payer: Self-pay | Admitting: Plastic Surgery

## 2023-12-11 LAB — SURGICAL PATHOLOGY

## 2023-12-16 ENCOUNTER — Telehealth: Payer: Self-pay | Admitting: Plastic Surgery

## 2023-12-16 ENCOUNTER — Telehealth: Payer: Self-pay

## 2023-12-16 NOTE — Telephone Encounter (Signed)
 I attempted to call the patient to discuss her phone call earlier to the clinic. There was no answer so I left a message for her to call the clinic back.

## 2023-12-16 NOTE — Telephone Encounter (Signed)
 Pt just called and is in a lot of pain, she said she has been dealing with constipation and wants to know what to do. She has tried laxatives and an enema and not sure what else to do, please call

## 2023-12-16 NOTE — Telephone Encounter (Signed)
 Attempted to call back but there was no answer. Left a message for the patient to call the clinic.

## 2023-12-16 NOTE — Telephone Encounter (Signed)
 I returned the patients call. She stated she took Colace a few days ago. She gave herself an enema last night which helped a little. Today she took Magnesium Citrate and started to feel nauseated/light headed. She denies any fevers but states she has had chills. I spoke with Estefana and she wanted me to let the patient know she needs to either see her PCP or go to the ED. I informed the patient and she conveyed understanding.

## 2023-12-16 NOTE — Telephone Encounter (Signed)
 Pt called back returning allison's call concerning her being in pain and constipated and not knowing what to do

## 2023-12-18 ENCOUNTER — Encounter: Payer: Self-pay | Admitting: Student

## 2023-12-18 ENCOUNTER — Telehealth (INDEPENDENT_AMBULATORY_CARE_PROVIDER_SITE_OTHER): Admitting: Psychiatry

## 2023-12-18 ENCOUNTER — Ambulatory Visit (INDEPENDENT_AMBULATORY_CARE_PROVIDER_SITE_OTHER): Admitting: Student

## 2023-12-18 ENCOUNTER — Encounter: Payer: Self-pay | Admitting: Psychiatry

## 2023-12-18 VITALS — BP 122/77 | HR 94 | Ht 62.0 in | Wt 143.0 lb

## 2023-12-18 DIAGNOSIS — G4701 Insomnia due to medical condition: Secondary | ICD-10-CM

## 2023-12-18 DIAGNOSIS — R232 Flushing: Secondary | ICD-10-CM | POA: Diagnosis not present

## 2023-12-18 DIAGNOSIS — F4310 Post-traumatic stress disorder, unspecified: Secondary | ICD-10-CM | POA: Diagnosis not present

## 2023-12-18 DIAGNOSIS — F429 Obsessive-compulsive disorder, unspecified: Secondary | ICD-10-CM | POA: Diagnosis not present

## 2023-12-18 DIAGNOSIS — N62 Hypertrophy of breast: Secondary | ICD-10-CM

## 2023-12-18 MED ORDER — ESZOPICLONE 2 MG PO TABS: 2.0000 mg | ORAL_TABLET | Freq: Every evening | ORAL | 2 refills | Status: AC | PRN

## 2023-12-18 NOTE — Progress Notes (Signed)
 Patient is a 58 year old female who recently underwent bilateral breast reduction with Dr. Lowery on 12/10/2023.  Patient is about 1 week postop.  She presents to the clinic today for postoperative follow-up.  Today, patient reports she is doing okay.  She states that she did end up having a bowel movement after enemas and magnesium citrate.  She states that she is now having bowel movements without difficulty.  She states that she is eating and drinking without issue.  She denies any fevers or chills.  She states pain has overall been controlled.  She reports she is ambulating without any difficulty.  She denies any other issues or concerns at this time.  Chaperone present on exam.  On exam, patient is sitting upright in no acute distress.  Breasts are soft and symmetric.  There is no overlying erythema to either breast, no obvious fluid collections on exam.  There is some ecchymosis noted over the breasts bilaterally and to the lateral aspects as well.  NAC's appear to be healthy.  Mepilex border dressings did appear to be soiled with a little bit of drainage.  These were removed.  A few of the Steri-Strips towards the superior aspect of the vertical limb incisions were soiled with drainage.  These were removed.  Incision underneath the Steri-Strips bilaterally appeared to be intact.  Steri-Strips were replaced and new Mepilex border dressings were placed.  There were no signs of infection on exam.  Recommended that patient continue with compression at all times and continue with lipo foam for another week or so.  Discussed with her to avoid strenuous activities.  Patient expressed understanding.  Encouraged patient to make sure she is drinking plenty of fluids and ambulating regularly.  Patient to follow back up at her next scheduled appointment.  Instructed her to call in the meantime she has any questions or concerns about anything.

## 2023-12-18 NOTE — Progress Notes (Signed)
 Virtual Visit via Video Note  I connected with Gloria Harrison on 12/18/23 at 11:00 AM EST by a video enabled telemedicine application and verified that I am speaking with the correct person using two identifiers.  Location Provider Location : [facility] Patient Location : Home  Participants: Patient , Provider   I discussed the limitations of evaluation and management by telemedicine and the availability of in person appointments. The patient expressed understanding and agreed to proceed.   I discussed the assessment and treatment plan with the patient. The patient was provided an opportunity to ask questions and all were answered. The patient agreed with the plan and demonstrated an understanding of the instructions.   The patient was advised to call back or seek an in-person evaluation if the symptoms worsen or if the condition fails to improve as anticipated.   BH MD OP Progress Note  12/18/2023 11:30 AM JEWELENE MAIRENA  MRN:  [id number]  Chief Complaint:  Chief Complaint  Patient presents with   Medication Refill   Follow-up   Anxiety   Depression   Discussed the use of AI scribe software for clinical note transcription with the patient, who gave verbal consent to proceed.  History of Present Illness Gloria Harrison is a 58 year old Caucasian female, married, lives in [place], has a history of PTSD, OCD, insomnia, hyperlipidemia, history of hysterectomy currently on hormonal replacement, was evaluated by telemedicine today.  She reports improved sleep after she increased her Lunesta  dose, stating that the higher dose helps her sleep much better. She currently takes Lunesta  (eszopiclone ) 2 mg at night for sleep, though she has experienced confusion about dosing. She also takes Cymbalta (duloxetine) 30 mg daily and denies any side effects.  She previously stopped Seroquel  due to restless legs and does not currently take any mood stabilizers.   Ongoing obsessive-compulsive symptoms include  a need for order and compulsive cleaning behaviors. She describes that everything in her environment must be in its place, and she becomes distressed when things are out of order. She provides the example of repeatedly adjusting her Christmas tree to ensure it appears full and symmetrical, and states that she cannot tolerate items on the floor or things being out of place. She identifies that these compulsions impact her daily functioning, as she feels compelled to repeatedly check and rearrange her environment. Compulsive worrying, especially regarding her adult son who has bipolar disorder and a history of suicide attempt, continues to affect her. She states that her therapy sessions focus on helping her manage these worries and allow her son more independence. She attends weekly therapy sessions, though she missed her most recent session due to her recent surgery and recovery.  She denies experiencing any recent PTSD-related symptoms and states that therapy has been helpful in managing post-traumatic stress. She denies paranoia, hallucinations, or other psychotic symptoms. She denies any thoughts of hurting herself or others. She reports that her appetite has returned to baseline following her recent surgery and recovery.  She recently had breast reduction with lipo suction dated 12/10/2023.  She reports taking oxycodone  5 mg every six hours as prescribed following recent breast reduction surgery, but states she only took approximately 2 doses before discontinuing use.  She is currently taking a break from work to recover from surgery at this planning to return on Monday.     Visit Diagnosis:    ICD-10-CM   1. PTSD (post-traumatic stress disorder)  F43.10     2. Obsessive-compulsive disorder, unspecified type  F42.9     3. Insomnia due to medical condition  G47.01 eszopiclone  (LUNESTA ) 2 MG TABS tablet   Mood symptoms, hot flashes      Past Psychiatric History: I have reviewed past psychiatric  history from progress note on 09/29/2023.  Past trials of Celexa , Cymbalta, trazodone , temazepam, clonazepam.  Past Medical History:  Past Medical History:  Diagnosis Date   Anxiety    Depression    GERD (gastroesophageal reflux disease)    Kidney stones 2015   twice   OCD (obsessive compulsive disorder)     Past Surgical History:  Procedure Laterality Date   BREAST REDUCTION SURGERY Bilateral 12/10/2023   Procedure: BREAST REDUCTION WITH LIPOSUCTION;  Surgeon: Lowery [name], DO;  Location: [facility];  Service: Plastics;  Laterality: Bilateral;   CESAREAN SECTION  1996   CESAREAN SECTION  2001   COLONOSCOPY WITH PROPOFOL  N/A 03/24/2018   Procedure: COLONOSCOPY WITH PROPOFOL ;  Surgeon: Janalyn Keene NOVAK, MD;  Location: [facility];  Service: Endoscopy;  Laterality: N/A;   COSMETIC SURGERY  2002   Tummy tuck   ESOPHAGOGASTRODUODENOSCOPY (EGD) WITH PROPOFOL  N/A 03/24/2018   Procedure: ESOPHAGOGASTRODUODENOSCOPY (EGD) WITH PROPOFOL ;  Surgeon: Janalyn Keene NOVAK, MD;  Location: [facility];  Service: Endoscopy;  Laterality: N/A;   LITHOTRIPSY  2006 & 2008   PARTIAL HYSTERECTOMY  03/2021   TONSILLECTOMY  1990    Family Psychiatric History: I have reviewed family psychiatric history from progress note on 09/29/2023.  Family History:  Family History  Problem Relation Age of Onset   Kidney Stones Mother    Hyperlipidemia Mother    Mental illness Mother    Hyperlipidemia Father    Hypertension Father    Cancer Father        Jaw tumor   Hypothyroidism Sister    Prostate cancer Maternal Uncle    Diabetes Maternal Uncle    Alcohol abuse Son    Bipolar disorder Son    Asthma Son    Suicidality Son    Colon cancer Neg Hx    Breast cancer Neg Hx     Social History: I have reviewed social history from progress note on 09/29/2023. Social History   Socioeconomic History   Marital status: Married    Spouse name: Not on file   Number of children: 2    Years of education: Not on file   Highest education level: Bachelor's degree (e.g., BA, AB, BS)  Occupational History   Occupation: Civil Engineer, Contracting   Occupation: Programme researcher, broadcasting/film/video   Tobacco Use   Smoking status: Never   Smokeless tobacco: Never  Vaping Use   Vaping status: Never Used  Substance and Sexual Activity   Alcohol use: No    Alcohol/week: 0.0 standard drinks of alcohol   Drug use: No   Sexual activity: Yes    Partners: Male    Birth control/protection: Other-see comments    Comment: Husband had a Vasectomy.  Other Topics Concern   Not on file  Social History Narrative   Not on file   Social Drivers of Health   Financial Resource Strain: Medium Risk (03/21/2023)   Received [facility]   Overall Financial Resource Strain (CARDIA)    Difficulty of Paying Living Expenses: Somewhat hard  Food Insecurity: No Food Insecurity (03/21/2023)   Received from [facility]   Hunger Vital Sign    Within the past 12 months, you worried that your food would run  out before you got the money to buy more.: Never true    Within the past 12 months, the food you bought just didn't last and you didn't have money to get more.: Never true  Transportation Needs: No Transportation Needs (03/21/2023)   Received from [facility]    In the past 12 months, has lack of transportation kept you from medical appointments or from getting medications?: No    Lack of Transportation (Non-Medical): No  Physical Activity: Not on file  Stress: Not on file  Social Connections: Not on file    Allergies:  Allergies  Allergen Reactions   Ciprofloxacin Hives   Ropinirole  Nausea Only    Metabolic Disorder Labs: Lab Results  Component Value Date   HGBA1C 5.8 (H) 04/17/2023   MPG 120 04/17/2023   MPG 114 11/05/2018   No results found for: PROLACTIN Lab Results  Component Value Date   CHOL 325 (H)  04/17/2023   TRIG 181 (H) 04/17/2023   HDL 52 04/17/2023   CHOLHDL 6.3 (H) 04/17/2023   LDLCALC 238 (H) 04/17/2023   LDLCALC 177 (H) 11/05/2018   Lab Results  Component Value Date   TSH 0.86 04/17/2023   TSH 0.723 12/02/2019    Therapeutic Level Labs: No results found for: LITHIUM No results found for: VALPROATE No results found for: CBMZ  Current Medications: Current Outpatient Medications  Medication Sig Dispense Refill   ondansetron  (ZOFRAN ) 4 MG tablet Take 1 tablet (4 mg total) by mouth every 8 (eight) hours as needed for up to 15 doses for nausea or vomiting. (Patient not taking: Reported on 12/18/2023) 15 tablet 0   Cholecalciferol (VITAMIN D ) 50 MCG (2000 UT) CAPS Take by mouth.     DULoxetine (CYMBALTA) 30 MG capsule Take 30 mg by mouth daily.     [START ON 12/31/2023] eszopiclone  (LUNESTA ) 2 MG TABS tablet Take 1 tablet (2 mg total) by mouth at bedtime as needed for sleep. Take immediately before bedtime 30 tablet 2   Glycerin (OPTASE COMFORT DRY EYE OP) Apply to eye.     MIEBO 1.338 GM/ML SOLN 4 drops daily. Each eye     oxyCODONE  (ROXICODONE ) 5 MG immediate release tablet Take 1 tablet (5 mg total) by mouth every 6 (six) hours as needed for up to 15 doses for severe pain (pain score 7-10). (Patient not taking: Reported on 12/18/2023) 15 tablet 0   pantoprazole  (PROTONIX ) 40 MG tablet TAKE 1 TABLET BY MOUTH ONCE DAILY BEFORE BREAKFAST 90 tablet 1   pravastatin  (PRAVACHOL ) 10 MG tablet TAKE 1 TABLET BY MOUTH AT BEDTIME 90 tablet 0   progesterone (PROMETRIUM) 100 MG capsule Take 200 mg by mouth daily.     testosterone (ANDROGEL) 50 MG/5GM (1%) GEL Place 5 g onto the skin daily.     No current facility-administered medications for this visit.     Musculoskeletal: Strength & Muscle Tone: UTA Gait & Station: Seated Patient leans: N/A  Psychiatric Specialty Exam: Review of Systems  Psychiatric/Behavioral:  The patient is nervous/anxious.     Last menstrual  period 08/29/2020.There is no height or weight on file to calculate BMI.  General Appearance: Casual  Eye Contact:  Fair  Speech:  Normal Rate  Volume:  Normal  Mood:  Anxious  Affect:  Congruent  Thought Process:  Goal Directed and Descriptions of Associations: Intact  Orientation:  Full (Time, Place, and Person)  Thought Content: Logical   Suicidal Thoughts:  No  Homicidal Thoughts:  No  Memory:  Immediate;   Fair Recent;   Fair Remote;   Fair  Judgement:  Fair  Insight:  Fair  Psychomotor Activity:  Normal  Concentration:  Concentration: Fair and Attention Span: Fair  Recall:  [employer] of Knowledge: Fair  Language: Fair  Akathisia:  No  Handed:  Right  AIMS (if indicated): not done  Assets:  Communication Skills Desire for Improvement Housing Social Support  ADL's:  Intact  Cognition: WNL  Sleep:  improving   Screenings: GAD-7    Loss Adjuster, Chartered Office Visit from 11/04/2023 in [facility] Office Visit from 09/29/2023 in [facility] Office Visit from 08/07/2023 in [facility] Office Visit from 04/17/2023 in [facility] Office Visit from [facility]/28/2022 [facility]  Total GAD-7 Score 15 21 21 14  0   PHQ2-9    Flowsheet Row Office Visit from 11/04/2023 in [facility] Office Visit from 09/29/2023 in [facility] Office Visit from 08/07/2023 in [facility] Office Visit from 04/17/2023 in [facility] Office Visit from 11/28/2022 in [facility]  PHQ-2 Total Score 3 5 4  0 0  PHQ-9 Total Score 10 19 14 5 1    Flowsheet Row Video Visit from 12/18/2023 in [facility] Video Visit from 11/13/2023 in [facility] Office Visit from 11/04/2023 in [facility]  C-SSRS RISK CATEGORY Moderate Risk Moderate Risk Moderate Risk     Assessment and Plan: CHER FRANZONI is a 58 year old Caucasian female with history of PTSD, OCD, insomnia was evaluated by telemedicine today.  Discussed assessment and plan as noted below.  1. PTSD (post-traumatic stress disorder)-improving Currently reports improvement in trauma-related symptoms.  Sleep has improved on the current medication regimen as well. Continue psychotherapy sessions with Ms. Berwyn Uchegbu Continue Cymbalta 30 mg daily.  Plan to increase the dosage in the future.  2. Obsessive-compulsive disorder, unspecified type-improving Reports ongoing need for symmetry as well as compulsive cleaning although currently working with therapist on the same and that has been beneficial. Continue Cymbalta as prescribed Continue psychotherapy sessions.  3. Insomnia due to medical condition-improving Currently reports sleep is overall improved on the current medication regimen. Continue Lunesta  2 mg at bedtime Continue sleep hygiene techniques. Reviewed [place] PMP AWARxE   Follow-up Follow-up in clinic in 4 weeks or sooner if needed.  Collaboration of Care: Collaboration of Care: Referral or follow-up with counselor/therapist AEB encouraged to continue psychotherapy sessions.  Patient/Guardian was advised Release of Information must be obtained prior to any record release in order to collaborate their care with an outside provider. Patient/Guardian was advised if they have not already done so to contact the registration department to sign all necessary forms in order for us  to release information regarding their care.   Consent: Patient/Guardian gives verbal consent for treatment and assignment of benefits for services provided during this visit. Patient/Guardian expressed understanding and agreed to proceed.    This note was generated in part or whole with voice recognition software. Voice recognition is usually quite accurate but there are transcription errors that can and very often do occur. I apologize for any typographical errors that were not detected and corrected.    Topaz Raglin, MD  12/18/2023, 11:30 AM

## 2023-12-29 ENCOUNTER — Telehealth: Payer: Self-pay

## 2023-12-29 NOTE — Telephone Encounter (Signed)
 Patient called requesting to increase her Lunesta  2mg  to 3 mg she stated that the  2 mg is no longer helping her to sleep please advise

## 2023-12-29 NOTE — Telephone Encounter (Signed)
 Please let patient know she could split her 2 mg tablet into half and take 1.5 tablets to make it 3 mg daily and try it for the next 10 days or so and let this provider know if it helps.  She should have enough supplies to do it.

## 2023-12-30 NOTE — Telephone Encounter (Signed)
 Spoke to patient she stated that she needs a refill due to being sedated and mixed the 1 mg with the 2 mg and was taking more than she should and she also stated that she mentioned this to you at her last visit please advise

## 2023-12-30 NOTE — Telephone Encounter (Signed)
 She has a script waiting at pharmacy to be picked up tomorrow and she can try the dose as I mentioned and let me know .

## 2023-12-31 ENCOUNTER — Telehealth: Payer: Self-pay | Admitting: Student

## 2023-12-31 NOTE — Telephone Encounter (Signed)
 Pt can only come in on Fridays, she said she can't miss work, I tried to r/s her to Monday but she can't do it, she does have an apt on 01-15-24 she wants to know if it ok to come them, she said she can't do any other day than Friday We are moving pt due to snow delay

## 2024-01-01 ENCOUNTER — Encounter: Admitting: Student

## 2024-01-04 NOTE — Telephone Encounter (Signed)
 Gloria Harrison,   I can see her this Friday at 8:00 AM if she would like  Thanks, Estefana

## 2024-01-04 NOTE — Telephone Encounter (Signed)
 Pt is sched 01-15-24 pt wants to know if it is ok to wait until 01-15-24 for a post op, PostOP: bil reduction / Ins / sx 12-10-23 [facility] 0730 , pt can only come on Fridays, she said she can not come any other day, please advise pt

## 2024-01-08 ENCOUNTER — Ambulatory Visit: Admitting: Student

## 2024-01-08 ENCOUNTER — Encounter: Payer: Self-pay | Admitting: Student

## 2024-01-08 VITALS — BP 111/66 | HR 83 | Ht 62.0 in | Wt 142.0 lb

## 2024-01-08 DIAGNOSIS — N62 Hypertrophy of breast: Secondary | ICD-10-CM

## 2024-01-08 DIAGNOSIS — Z9889 Other specified postprocedural states: Secondary | ICD-10-CM

## 2024-01-08 NOTE — Progress Notes (Signed)
 Patient is a 58 year old female who recently underwent bilateral breast reduction with Dr. Lowery on 12/10/2023.  Patient is about 4 weeks postop.  She presents to the clinic today for postoperative follow-up.     Patient was last seen in the clinic on 12/18/2023.  At this visit, patient was doing okay.  On exam, breasts are soft and symmetric.  NAC's were healthy.  There were no signs of infection on exam.  Today, patient reports she is doing well.  She reports that she still has some mild discomfort from time to time and will take a Tylenol , but denies any significant pain.  She denies any other issues or concerns.  She denies any fevers or chills.  She reports that her GI issues have completely resolved since her last appointment.  Chaperone present on exam.  On exam, patient sitting upright in no acute distress.  Breasts are overall soft and symmetric.  There does appear to be a little bit of scar tissue noted to the lateral breasts bilaterally.  There are no overlying skin changes.  There is no overlying erythema, no obvious fluid collections on exam.  NAC's appear to be healthy.  Steri-Strips are in place over the incisions bilaterally.  These were removed.  Incisions appear to be intact and healing well.  Several Monocryl sutures were noted.  These were cut and removed and patient tolerated well.  There are no signs of infection on exam.  Discussed with the patient that it appears she is overall healing well.  Recommended that she apply Vaseline to her incisions daily for the next 2 to 3 weeks.  Discussed with her that after that time, she may transition to scar creams such as Mederma, silicone-based scar creams or tapes.  Also recommended that she gently massage the scar tissue to the breasts bilaterally.  Patient expressed understanding.  Patient expressed understanding.  Discussed with the patient that she should wear compression for another 2 weeks before she transitions into a regular bra  with no underwire.  Discussed with the patient that she will also have restrictions for another 2 weeks.  Discussed with her that she should gradually increase her activities at that time.  Encouraged her to continue with gentle range of motion of the bilateral upper extremities in the meantime.  She expressed understanding.  I offered the patient another appointment for another few weeks, but she states that she is feeling well and declined at this time.  I discussed with the patient she may follow-up as needed and if she has any questions or concerns about anything she may call us .  She expressed understanding.  Pictures were obtained of the patient and placed in the chart with the patient's or guardian's permission.

## 2024-01-15 ENCOUNTER — Encounter: Payer: Self-pay | Admitting: Psychiatry

## 2024-01-15 ENCOUNTER — Telehealth: Admitting: Psychiatry

## 2024-01-15 ENCOUNTER — Encounter: Admitting: Plastic Surgery

## 2024-01-15 DIAGNOSIS — F4310 Post-traumatic stress disorder, unspecified: Secondary | ICD-10-CM

## 2024-01-15 DIAGNOSIS — F429 Obsessive-compulsive disorder, unspecified: Secondary | ICD-10-CM | POA: Diagnosis not present

## 2024-01-15 DIAGNOSIS — G4701 Insomnia due to medical condition: Secondary | ICD-10-CM

## 2024-01-15 DIAGNOSIS — R232 Flushing: Secondary | ICD-10-CM | POA: Diagnosis not present

## 2024-01-15 MED ORDER — ESZOPICLONE 3 MG PO TABS: 3.0000 mg | ORAL_TABLET | Freq: Every day | ORAL | 1 refills | Status: AC

## 2024-01-15 NOTE — Progress Notes (Signed)
 Virtual Visit via Video Note  I connected with Gloria Harrison on 01/15/2024 at 10:30 AM EST by a video enabled telemedicine application and verified that I am speaking with the correct person using two identifiers.  Location Provider Location : [facility] Patient Location : Home  Participants: Patient , Provider    I discussed the limitations of evaluation and management by telemedicine and the availability of in person appointments. The patient expressed understanding and agreed to proceed.   I discussed the assessment and treatment plan with the patient. The patient was provided an opportunity to ask questions and all were answered. The patient agreed with the plan and demonstrated an understanding of the instructions.   The patient was advised to call back or seek an in-person evaluation if the symptoms worsen or if the condition fails to improve as anticipated.  BH MD OP Progress Note  01/15/2024 10:46 AM PARRIS SIGNER  MRN:  [id number]  Chief Complaint:  Chief Complaint  Patient presents with   Medication Refill   Follow-up   Anxiety   Depression   Insomnia   Discussed the use of AI scribe software for clinical note transcription with the patient, who gave verbal consent to proceed.  History of Present Illness Gloria Harrison is a 58 year old Caucasian female, married, lives in [place], has a history of PTSD, OCD, insomnia, hyperlipidemia, history of hysterectomy, currently on hormonal replacement was evaluated by telemedicine today.  She reports improved sleep after she increased her Lunesta  dose from 2 mg to 3 mg; she initially experienced restless legs on the first night of the dose increase but has had no further issues. She reports that she is sleeping better and feels her energy and other symptoms have improved. She continues to take Cymbalta 30 mg and Lunesta  3 mg nightly.  She continues to experience ongoing grief related to the loss of her father last year, and she states  that the holiday season has been difficult and she feels ready for it to be over. She reports that overwhelm and stress occur at times, particularly during the holidays, but her husband provides understanding and support.  She denies current obsessions or compulsive behaviors. She continues weekly therapy sessions, and she finds these helpful for her mood and sleep. She reports that therapy has helped her address feelings of guilt, particularly related to her oldest son, and she tends to be self-critical. She describes improvement in her mood and functioning after therapy sessions.  She denies suicidal thoughts or thoughts of harming others.    Visit Diagnosis:    ICD-10-CM   1. PTSD (post-traumatic stress disorder)  F43.10     2. Obsessive-compulsive disorder, unspecified type  F42.9     3. Insomnia due to medical condition  G47.01 Eszopiclone  3 MG TABS   Mood symptoms, hot flashes      Past Psychiatric History: I have reviewed past psychiatric history from progress note on 09/29/2023.  Past trials of Celexa , trazodone , temazepam, clonazepam.  Past Medical History:  Past Medical History:  Diagnosis Date   Anxiety    Depression    GERD (gastroesophageal reflux disease)    Kidney stones 2015   twice   OCD (obsessive compulsive disorder)     Past Surgical History:  Procedure Laterality Date   BREAST REDUCTION SURGERY Bilateral 12/10/2023   Procedure: BREAST REDUCTION WITH LIPOSUCTION;  Surgeon: Lowery [name], DO;  Location: [facility];  Service: Plastics;  Laterality: Bilateral;   CESAREAN SECTION  1996   CESAREAN SECTION  2001   COLONOSCOPY WITH PROPOFOL  N/A 03/24/2018   Procedure: COLONOSCOPY WITH PROPOFOL ;  Surgeon: Janalyn Keene NOVAK, MD;  Location: [facility];  Service: Endoscopy;  Laterality: N/A;   COSMETIC SURGERY  2002   Tummy tuck   ESOPHAGOGASTRODUODENOSCOPY (EGD) WITH PROPOFOL  N/A 03/24/2018   Procedure: ESOPHAGOGASTRODUODENOSCOPY (EGD)  WITH PROPOFOL ;  Surgeon: Janalyn Keene NOVAK, MD;  Location: [facility];  Service: Endoscopy;  Laterality: N/A;   LITHOTRIPSY  2006 & 2008   PARTIAL HYSTERECTOMY  03/2021   TONSILLECTOMY  1990    Family Psychiatric History: I have reviewed family psychiatric history from progress note on 09/29/2023.  Family History:  Family History  Problem Relation Age of Onset   Kidney Stones Mother    Hyperlipidemia Mother    Mental illness Mother    Hyperlipidemia Father    Hypertension Father    Cancer Father        Jaw tumor   Hypothyroidism Sister    Prostate cancer Maternal Uncle    Diabetes Maternal Uncle    Alcohol abuse Son    Bipolar disorder Son    Asthma Son    Suicidality Son    Colon cancer Neg Hx    Breast cancer Neg Hx     Social History: I have reviewed social history from progress note on 09/29/2023. Social History   Socioeconomic History   Marital status: Married    Spouse name: Not on file   Number of children: 2   Years of education: Not on file   Highest education level: Bachelor's degree (e.g., BA, AB, BS)  Occupational History   Occupation: Civil Engineer, Contracting   Occupation: Programme researcher, broadcasting/film/video   Tobacco Use   Smoking status: Never   Smokeless tobacco: Never  Vaping Use   Vaping status: Never Used  Substance and Sexual Activity   Alcohol use: No    Alcohol/week: 0.0 standard drinks of alcohol   Drug use: No   Sexual activity: Yes    Partners: Male    Birth control/protection: Other-see comments    Comment: Husband had a Vasectomy.  Other Topics Concern   Not on file  Social History Narrative   Not on file   Social Drivers of Health   Tobacco Use: Low Risk (01/15/2024)   Patient History    Smoking Tobacco Use: Never    Smokeless Tobacco Use: Never    Passive Exposure: Not on file  Financial Resource Strain: Medium Risk (03/21/2023)   Received from [facility]   Overall Financial Resource Strain (CARDIA)    Difficulty  of Paying Living Expenses: Somewhat hard  Food Insecurity: No Food Insecurity (03/21/2023)   Received from [facility]   Epic    Within the past 12 months, you worried that your food would run out before you got the money to buy more.: Never true    Within the past 12 months, the food you bought just didn't last and you didn't have money to get more.: Never true  Transportation Needs: No Transportation Needs (03/21/2023)   Received from [facility]   PRAPARE - Transportation    In the past 12 months, has lack of transportation kept you from medical appointments or from getting medications?: No    Lack of Transportation (Non-Medical): No  Physical Activity: Not on file  Stress: Not on file  Social Connections: Not on file  Depression (PHQ2-9): Medium Risk (11/04/2023)  Depression (PHQ2-9)    PHQ-2 Score: 10  Alcohol Screen: Not on file  Housing: Unknown (03/21/2023)   Received from [facility]   Epic    In the last 12 months, was there a time when you were not able to pay the mortgage or rent on time?: No    Number of Times Moved in the Last Year: Not on file    At any time in the past 12 months, were you homeless or living in a shelter (including now)?: No  Utilities: Not At Risk (03/21/2023)   Received from [facility]    Threatened with loss of utilities: No  Health Literacy: Not on file    Allergies: Allergies[1]  Metabolic Disorder Labs: Lab Results  Component Value Date   HGBA1C 5.8 (H) 04/17/2023   MPG 120 04/17/2023   MPG 114 11/05/2018   No results found for: PROLACTIN Lab Results  Component Value Date   CHOL 325 (H) 04/17/2023   TRIG 181 (H) 04/17/2023   HDL 52 04/17/2023   CHOLHDL 6.3 (H) 04/17/2023   LDLCALC 238 (H) 04/17/2023   LDLCALC 177 (H) 11/05/2018   Lab Results  Component Value Date   TSH 0.86 04/17/2023   TSH 0.723 12/02/2019    Therapeutic Level Labs: No  results found for: LITHIUM No results found for: VALPROATE No results found for: CBMZ  Current Medications: Current Outpatient Medications  Medication Sig Dispense Refill   Eszopiclone  3 MG TABS Take 1 tablet (3 mg total) by mouth at bedtime. Take immediately before bedtime 30 tablet 1   ondansetron  (ZOFRAN ) 4 MG tablet Take 1 tablet (4 mg total) by mouth every 8 (eight) hours as needed for up to 15 doses for nausea or vomiting. (Patient not taking: Reported on 12/18/2023) 15 tablet 0   Cholecalciferol (VITAMIN D ) 50 MCG (2000 UT) CAPS Take by mouth.     DULoxetine (CYMBALTA) 30 MG capsule Take 30 mg by mouth daily.     Glycerin (OPTASE COMFORT DRY EYE OP) Apply to eye.     MIEBO 1.338 GM/ML SOLN 4 drops daily. Each eye     oxyCODONE  (ROXICODONE ) 5 MG immediate release tablet Take 1 tablet (5 mg total) by mouth every 6 (six) hours as needed for up to 15 doses for severe pain (pain score 7-10). (Patient not taking: Reported on 12/18/2023) 15 tablet 0   pantoprazole  (PROTONIX ) 40 MG tablet TAKE 1 TABLET BY MOUTH ONCE DAILY BEFORE BREAKFAST 90 tablet 1   pravastatin  (PRAVACHOL ) 10 MG tablet TAKE 1 TABLET BY MOUTH AT BEDTIME 90 tablet 0   progesterone (PROMETRIUM) 100 MG capsule Take 200 mg by mouth daily.     testosterone (ANDROGEL) 50 MG/5GM (1%) GEL Place 5 g onto the skin daily.     No current facility-administered medications for this visit.     Musculoskeletal: Strength & Muscle Tone: UTA Gait & Station: Seated Patient leans: N/A  Psychiatric Specialty Exam: Review of Systems  Psychiatric/Behavioral:  Positive for sleep disturbance. The patient is nervous/anxious.     Last menstrual period 08/29/2020.There is no height or weight on file to calculate BMI.  General Appearance: Casual  Eye Contact:  Fair  Speech:  Normal Rate  Volume:  Normal  Mood:  Anxious  Affect:  Congruent  Thought Process:  Goal Directed and Descriptions of Associations: Intact  Orientation:  Full  (Time, Place, and Person)  Thought Content: Logical   Suicidal Thoughts:  No  Homicidal Thoughts:  No  Memory:  Immediate;   Fair Recent;   Fair Remote;   Fair  Judgement:  Fair  Insight:  Fair  Psychomotor Activity:  Normal  Concentration:  Concentration: Fair and Attention Span: Fair  Recall:  [employer] of Knowledge: Fair  Language: Fair  Akathisia:  No  Handed:  Right  AIMS (if indicated): not done  Assets:  Communication Skills Desire for Improvement Housing Social Support Transportation  ADL's:  Intact  Cognition: WNL  Sleep:  Improving   Screenings: GAD-7    Loss Adjuster, Chartered Office Visit from 11/04/2023 in [facility] Office Visit from 09/29/2023 in [facility] Office Visit from 08/07/2023 in [facility] Office Visit from 04/17/2023 in [facility] Office Visit from 11/28/2022 in [facility]  Total GAD-7 Score 15 21 21 14  0   PHQ2-9    Flowsheet Row Office Visit from 11/04/2023 in [facility] Office Visit from 09/29/2023 in [facility] Office Visit from 08/07/2023 in [facility] Office Visit from 04/17/2023 in [facility] Office Visit from 11/28/2022 in [facility]  PHQ-2 Total Score 3 5 4  0 0  PHQ-9 Total Score 10 19 14 5 1    Flowsheet Row Video Visit from 01/15/2024 in [facility] Video Visit from 12/18/2023 in [facility] Video Visit from 11/13/2023 in [facility]  C-SSRS RISK CATEGORY Moderate Risk Moderate Risk Moderate Risk     Assessment and Plan: PAMMY VESEY is a 58 year old Caucasian female who presented for a follow-up  appointment, discussed assessment and plan as noted below.  1. PTSD (post-traumatic stress disorder)-improving Currently reports improvement in mood symptoms.  Therapy has been beneficial. Continue Cymbalta 30 mg daily Continue psychotherapy sessions with Ms. Valerie Uchegbu  2. Obsessive-compulsive disorder, unspecified type-improving Currently reports compulsive cleaning has improved. Continue Cymbalta as prescribed Continue CBT  3. Insomnia due to medical condition-improving Sleep improved on the higher dosage of Lunesta  Continue Lunesta  3 mg at bedtime Continue sleep hygiene techniques. Reviewed [place] PMP AWARxE     Collaboration of Care: Collaboration of Care: Referral or follow-up with counselor/therapist AEB encouraged to continue psychotherapy sessions.  Patient/Guardian was advised Release of Information must be obtained prior to any record release in order to collaborate their care with an outside provider. Patient/Guardian was advised if they have not already done so to contact the registration department to sign all necessary forms in order for us  to release information regarding their care.   Consent: Patient/Guardian gives verbal consent for treatment and assignment of benefits for services provided during this visit. Patient/Guardian expressed understanding and agreed to proceed.  This note was generated in part or whole with voice recognition software. Voice recognition is usually quite accurate but there are transcription errors that can and very often do occur. I apologize for any typographical errors that were not detected and corrected.     Inella Kuwahara, MD 01/15/2024, 10:46 AM     [1]  Allergies Allergen Reactions   Ciprofloxacin Hives   Ropinirole  Nausea Only

## 2024-01-26 ENCOUNTER — Encounter (INDEPENDENT_AMBULATORY_CARE_PROVIDER_SITE_OTHER): Payer: Self-pay

## 2024-01-26 DIAGNOSIS — F4310 Post-traumatic stress disorder, unspecified: Secondary | ICD-10-CM | POA: Diagnosis not present

## 2024-01-26 DIAGNOSIS — F429 Obsessive-compulsive disorder, unspecified: Secondary | ICD-10-CM

## 2024-01-26 DIAGNOSIS — F32A Depression, unspecified: Secondary | ICD-10-CM | POA: Insufficient documentation

## 2024-01-26 DIAGNOSIS — G4701 Insomnia due to medical condition: Secondary | ICD-10-CM

## 2024-01-26 MED ORDER — DULOXETINE HCL 30 MG PO CPEP: 30.0000 mg | ORAL_CAPSULE | Freq: Two times a day (BID) | ORAL | 0 refills | Status: AC

## 2024-01-26 NOTE — Telephone Encounter (Signed)
 Patient contacted provider with worsening mood symptoms.  Will increase Cymbalta to 30 mg twice daily.  Patient was advised to follow-up with therapist in the meantime and to schedule a sooner visit for further medication changes.  I have reviewed labs including CBC with differential, CMP dated 04/17/2023-within normal limits.  Will consider repeating this in the future.   I have spent at least 5 minutes non face to face with patient today.

## 2024-02-05 ENCOUNTER — Other Ambulatory Visit: Payer: Self-pay | Admitting: Family Medicine

## 2024-02-05 DIAGNOSIS — E78019 Familial hypercholesterolemia, unspecified: Secondary | ICD-10-CM

## 2024-02-05 NOTE — Telephone Encounter (Signed)
 Requested Prescriptions  Pending Prescriptions Disp Refills   pravastatin  (PRAVACHOL ) 10 MG tablet [Pharmacy Med Name: Pravastatin  Sodium 10 MG Oral Tablet] 90 tablet 0    Sig: TAKE 1 TABLET BY MOUTH AT BEDTIME     Cardiovascular:  Antilipid - Statins Failed - 02/05/2024  4:12 PM      Failed - Lipid Panel in normal range within the last 12 months    Cholesterol  Date Value Ref Range Status  04/17/2023 325 (H) <200 mg/dL Final   LDL Cholesterol (Calc)  Date Value Ref Range Status  04/17/2023 238 (H) mg/dL (calc) Final    Comment:    LDL-C levels > or = 190 mg/dL may indicate familial  hypercholesterolemia (FH). Clinical assessment and  measurement of blood lipid levels should be  considered for all first degree relatives of patients with an FH diagnosis. LDL Cholesterol (LDL-C) levels > or = 300 mg/dL may indicate homozygous familial hypercholesterolemia (HoFH). Untreated,  these extremely high LDL-C levels can result in premature CV events and mortality. Patients should be identified early and provided appropriate interventions to reduce the cumulative LDL-C burden from birth. . For questions about testing for familial hypercholesterolemia, please call Engineer, [employer] at 1.866.GENE.INFO. SABRA Veatrice DASEN, et al. J National Lipid Association  Recommendations for Patient-Centered Management of Dyslipidemia: Part 1 Journal of  Clinical Lipidology 2015;9(2), 129-169. Cuchel, M. et al. (2014). Homozygous familial hypercholesterolaemia: new insights and guidance for clinicians to improve detection and clinical management. European Heart Journal, 35(32), [phone]. Reference range: <100 . Desirable range <100 mg/dL for primary prevention;   <70 mg/dL for patients with CHD or diabetic patients  with > or = 2 CHD risk factors. SABRA LDL-C is now calculated using the Martin-Hopkins  calculation, which is a validated novel method providing  better accuracy than the  Friedewald equation in the  estimation of LDL-C.  Gladis APPLETHWAITE et al. SANDREA. 7986;689(80): 2061-2068  (http://education.QuestDiagnostics.com/faq/FAQ164)    HDL  Date Value Ref Range Status  04/17/2023 52 > OR = 50 mg/dL Final   Triglycerides  Date Value Ref Range Status  04/17/2023 181 (H) <150 mg/dL Final         Passed - Patient is not pregnant      Passed - Valid encounter within last 12 months    Recent Outpatient Visits           6 months ago Other obsessive-compulsive disorders   [place] [facility] Marsa PARAS, DO   9 months ago Peripheral polyneuropathy   [place] [facility] [place], Marsa PARAS, [place]

## 2024-02-12 ENCOUNTER — Encounter: Payer: Self-pay | Admitting: Student

## 2024-02-12 ENCOUNTER — Ambulatory Visit: Admitting: Student

## 2024-02-12 VITALS — BP 110/73 | HR 78 | Ht 62.0 in | Wt 143.6 lb

## 2024-02-12 DIAGNOSIS — N62 Hypertrophy of breast: Secondary | ICD-10-CM

## 2024-02-12 NOTE — Progress Notes (Signed)
 Patient is a 59 year old female who recently underwent bilateral breast reduction with Dr. Lowery on 12/10/2023.  Patient is about 2 weeks postop.  She presents to the clinic today with a concern about a possible skin tag.         Patient was last seen in the clinic on 01/08/2024.  At this visit, patient was doing well.  On exam, breasts are soft and symmetric.  NAC's appear to be healthy.  Incisions were intact and healing well.  There were no signs of infection.    Today, patient reports she is doing well.  She states that she noticed a small bump to her inferior vertical limb incision.  She reports that it is a little bit tender.  She does not report any drainage from the area.  She reports that it has not grown in size.  She does not report any fevers or chills.  She denies any other issues or concerns.  Chaperone present on exam.  On exam, patient sitting upright in no acute distress.  Breasts are overall soft and symmetric.  There is no overlying erythema, no obvious fluid collections on exam.  NAC's appear to be healthy bilaterally.  Incisions appear to be well-healed.  To the right inferior vertical limb incision, there is a small bump consistent with a suture underneath the skin.  It does appear to be mildly irritated.  There is no cellulitic changes.  No active drainage on exam.  To the left inferior vertical limb incision, there does appear to be a suture underneath the skin as well, not as pronounced as the right side though.  There are no signs of infection.  Discussed with the patient that it appears to be a suture underneath the skin.  Discussed with her that this should dissolve over time.  Recommended that she gently massage the bump to help facilitate it to dissolve.  We did discuss possibility of excision of the suture with Dr. Lowery.  We discussed that this would most likely be a procedure in the office.  We we will tentatively try to get her set up for the procedure, but I  did discuss with her that I will have to confirm with Dr. Lowery.  She expressed understanding and will continue to monitor it in the meantime and continue to massage it.  I recommended that patient massage the sutures underneath the skin on her left side as well to help facilitate them dissolving.  Discussed with the patient she no longer has to wear an compression bra and may regular bra without underwire.  All of the patient's questions were answered to her satisfaction.  I instructed the patient to call with any questions or concerns about anything.  Pictures were obtained of the patient and placed in the chart with the patient's or guardian's permission.

## 2024-02-26 ENCOUNTER — Encounter: Admitting: Student

## 2024-03-11 ENCOUNTER — Encounter: Admitting: Student

## 2024-03-15 ENCOUNTER — Encounter: Admitting: Plastic Surgery

## 2024-03-31 ENCOUNTER — Ambulatory Visit: Admitting: Psychiatry
